# Patient Record
Sex: Male | Born: 1950
Health system: Southern US, Community
[De-identification: ages and names within clinical notes are randomized; demographics above are authoritative.]

## PROBLEM LIST (undated history)

## (undated) DIAGNOSIS — H3530 Unspecified macular degeneration: Secondary | ICD-10-CM

## (undated) DIAGNOSIS — H3320 Serous retinal detachment, unspecified eye: Secondary | ICD-10-CM

## (undated) DIAGNOSIS — I422 Other hypertrophic cardiomyopathy: Secondary | ICD-10-CM

## (undated) DIAGNOSIS — M1990 Unspecified osteoarthritis, unspecified site: Secondary | ICD-10-CM

## (undated) DIAGNOSIS — N200 Calculus of kidney: Secondary | ICD-10-CM

## (undated) HISTORY — DX: Calculus of kidney: N20.0

## (undated) HISTORY — DX: Unspecified macular degeneration: H35.30

## (undated) HISTORY — DX: Serous retinal detachment, unspecified eye: H33.20

## (undated) HISTORY — DX: Unspecified osteoarthritis, unspecified site: M19.90

## (undated) HISTORY — DX: Other hypertrophic cardiomyopathy: I42.2

## (undated) HISTORY — PX: HIP SURGERY: SHX245

---
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
## 1986-06-07 HISTORY — PX: BACK SURGERY: SHX140

## 1996-06-07 HISTORY — PX: KNEE SURGERY: SHX244

## 1999-09-30 ENCOUNTER — Encounter: Admission: RE | Admit: 1999-09-30 | Discharge: 1999-09-30 | Payer: Self-pay | Admitting: Internal Medicine

## 1999-09-30 ENCOUNTER — Encounter: Payer: Self-pay | Admitting: Internal Medicine

## 2001-02-07 ENCOUNTER — Encounter: Admission: RE | Admit: 2001-02-07 | Discharge: 2001-02-07 | Payer: Self-pay | Admitting: Internal Medicine

## 2001-02-07 ENCOUNTER — Encounter: Payer: Self-pay | Admitting: Internal Medicine

## 2002-02-22 ENCOUNTER — Ambulatory Visit (HOSPITAL_COMMUNITY): Admission: RE | Admit: 2002-02-22 | Discharge: 2002-02-22 | Payer: Self-pay | Admitting: Gastroenterology

## 2009-01-06 ENCOUNTER — Encounter: Payer: Self-pay | Admitting: Cardiology

## 2009-01-07 ENCOUNTER — Ambulatory Visit (HOSPITAL_COMMUNITY): Admission: RE | Admit: 2009-01-07 | Discharge: 2009-01-07 | Payer: Self-pay | Admitting: Cardiology

## 2010-05-21 ENCOUNTER — Ambulatory Visit: Payer: Self-pay | Admitting: Cardiology

## 2010-06-11 ENCOUNTER — Telehealth (INDEPENDENT_AMBULATORY_CARE_PROVIDER_SITE_OTHER): Payer: Self-pay | Admitting: Radiology

## 2010-06-15 ENCOUNTER — Encounter: Payer: Self-pay | Admitting: Cardiology

## 2010-06-15 ENCOUNTER — Encounter (HOSPITAL_COMMUNITY)
Admission: RE | Admit: 2010-06-15 | Discharge: 2010-07-07 | Payer: Self-pay | Source: Home / Self Care | Attending: Cardiology | Admitting: Cardiology

## 2010-06-15 ENCOUNTER — Ambulatory Visit: Admission: RE | Admit: 2010-06-15 | Discharge: 2010-06-15 | Payer: Self-pay | Source: Home / Self Care

## 2010-06-25 ENCOUNTER — Encounter
Admission: RE | Admit: 2010-06-25 | Discharge: 2010-06-25 | Payer: Self-pay | Source: Home / Self Care | Attending: Cardiology | Admitting: Cardiology

## 2010-06-29 ENCOUNTER — Ambulatory Visit: Payer: Self-pay | Admitting: Cardiology

## 2010-06-30 ENCOUNTER — Ambulatory Visit
Admission: RE | Admit: 2010-06-30 | Payer: Self-pay | Source: Home / Self Care | Attending: Cardiology | Admitting: Cardiology

## 2010-07-02 NOTE — Procedures (Addendum)
  NAMEBAYLEY, HURN NO.:  [id number]  MEDICAL RECORD NO.:  [id number]          PATIENT TYPE:  OIB  LOCATION:  1965                         FACILITY:  [facility]  PHYSICIAN:  Colleen Can. Deborah Chalk, M.D.DATE OF BIRTH:  Dec 03, 1950  DATE OF PROCEDURE:  06/30/2010 DATE OF DISCHARGE:  06/30/2010                           CARDIAC CATHETERIZATION   PROCEDURE:  Left heart catheterization with selective coronary angiography, left ventricular angiography.  TYPE AND SITE OF ENTRY:  Percutaneous right femoral artery.  CATHETERS:  4-French 4 curved Judkins right-left coronary catheter, 4- French pigtail ventriculographic catheter.  CONTRAST:  Omnipaque.  MEDICATIONS GIVEN PRIOR TO PROCEDURE:  Valium 2 mg.  MEDICATIONS GIVEN DURING PROCEDURE:  Versed 2 mg IV.  COMMENTS:  The patient tolerated the procedure well.  HEMODYNAMIC DATA:  The aortic pressure was 94/70, LV was 94/16.  There was no aortic valve gradient noted on pullback.  ANGIOGRAPHIC DATA:  Left ventricular angiogram was performed in the RAO position.  Overall, cardiac size and silhouette were normal.  The global ejection fraction was estimated to be at 60%.  There was no mitral regurgitation, intracardiac calcification, or intracavitary filling defect.  Regional wall motion was normal.  CORONARY ARTERIES:  Coronary arteries arise and distribute normally.  It is a large dominant right coronary artery system.  FINDINGS: 1. Left main coronary artery is normal. 2. Left anterior descending had minor irregularities, but otherwise     was essentially normal. 3. Left circumflex was a moderate-sized vessel.  It is normal. 4. The right coronary artery is a large dominant vessel.  It is     normal.  OVERALL IMPRESSION: 1. Normal left ventricular function. 2. Normal coronary arteries.  DISCUSSION:  In light of these findings, it is felt that Mr. Surrette has an excellent prognosis.  The equivocal findings on the  inferior septum of the radionuclide study is felt to be an error, and it is felt that he does not have any underlying inducible ischemia.  Overall, it is felt his prognosis is excellent.  He does have known proximal septal hypertrophy diagnosed by echocardiogram.  He did not have any outflow gradient detected by pullback of the catheters.     Colleen Can. Deborah Chalk, M.D.     SNT/MEDQ  D:  06/30/2010  T:  07/01/2010  Job:  [id number]  Electronically Signed by Roger Shelter M.D. on 07/02/2010 03:38:50 PM

## 2010-07-08 ENCOUNTER — Ambulatory Visit (INDEPENDENT_AMBULATORY_CARE_PROVIDER_SITE_OTHER): Payer: Self-pay | Admitting: *Deleted

## 2010-07-08 DIAGNOSIS — I2510 Atherosclerotic heart disease of native coronary artery without angina pectoris: Secondary | ICD-10-CM

## 2010-07-09 NOTE — Progress Notes (Signed)
Summary: nuc pre-procedure  Phone Note Outgoing Call   Call placed by: Domenic Polite, CNMT,  June 11, 2010 12:24 PM Call placed to: Patient Reason for Call: Confirm/change Appt Summary of Call: Left message with information on Myoview Information Sheet (see scanned document for details).      Nuclear Med Background Indications for Stress Test: Evaluation for Ischemia   History: Echo  History Comments: Echo- prox. septal hypertrophy  Symptoms: DOE, SOB    Nuclear Pre-Procedure Cardiac Risk Factors: Family History - CAD, Lipids

## 2010-07-09 NOTE — Assessment & Plan Note (Signed)
Summary: Cardiology Nuclear Testing  Nuclear Med Background Indications for Stress Test: Evaluation for Ischemia   History: Echo  History Comments: Echo- prox. septal hypertrophy  Symptoms: DOE, Palpitations, SOB    Nuclear Pre-Procedure Cardiac Risk Factors: Family History - CAD, Lipids Caffeine/Decaff Intake: none NPO After: 8:00 PM Lungs: clear IV 0.9% NS with Angio Cath: 20g     IV Site: R Hand IV Started by: Cathlyn Parsons, RN Chest Size (in) 42     Height (in): 71 Weight (lb): 198 BMI: 27.72 Tech Comments: Atenolol taken this am at 0645 per Dr. Deborah Chalk instructions.  Nuclear Med Study 1 or 2 day study:  1 day     Stress Test Type:  Stress Reading MD:  Olga Millers, MD     Referring MD:  S.Tennant Resting Radionuclide:  Technetium 82m Tetrofosmin     Resting Radionuclide Dose:  11 mCi  Stress Radionuclide:  Technetium 37m Tetrofosmin     Stress Radionuclide Dose:  33 mCi   Stress Protocol Exercise Time (min):  13:00 min     Max HR:  144 bpm     Predicted Max HR:  160 bpm  Max Systolic BP: 150 mm Hg     Percent Max HR:  90 %     METS: 15.3 Rate Pressure Product:  16109    Stress Test Technologist:  Milana Na, EMT-P     Nuclear Technologist:  Doyne Keel, CNMT  Rest Procedure  Myocardial perfusion imaging was performed at rest 45 minutes following the intravenous administration of Technetium 70m Tetrofosmin.  Stress Procedure  The patient exercised for 13:00. The patient stopped due to fatigue and denied any chest pain.  There were no significant ST-T wave changes and rare pacs/pvcs.  Technetium 51m Tetrofosmin was injected at peak exercise and myocardial perfusion imaging was performed after a brief delay.  QPS Raw Data Images:  Acquisition technically good; normal left ventricular size. Stress Images:  There is decreased uptake in the inferior wall Rest Images:  There is decreased uptake in the inferior wall, less prominent compared to the stress  images. Subtraction (SDS):  These findings are consistent with inferior thinning and mild ischemia in the inferobasal wall. Transient Ischemic Dilatation:  .98  (Normal <1.22)  Lung/Heart Ratio:  .29  (Normal <0.45)  Quantitative Gated Spect Images QGS EDV:  119 ml QGS ESV:  46 ml QGS EF:  61 % QGS cine images:  Normal wall motion.   Overall Impression  Exercise Capacity: Excellent exercise capacity. BP Response: Normal blood pressure response. Clinical Symptoms: No chest pain ECG Impression: No significant ST segment change suggestive of ischemia. Overall Impression: Abnormal stress nuclear study with inferior thinning and mild ischemia in the inferobasal wall.  Appended Document: Cardiology Nuclear Testing COPY SENT TO DR.TENNANT

## 2010-07-14 ENCOUNTER — Ambulatory Visit: Payer: Self-pay | Admitting: *Deleted

## 2010-07-28 NOTE — H&P (Signed)
  NAMEBRAELYNN, LUPTON NO.:  [id number]  MEDICAL RECORD NO.:  [id number]           PATIENT TYPE:  LOCATION:                                 FACILITY:  PHYSICIAN:  Colleen Can. Deborah Chalk, M.D.DATE OF BIRTH:  July 18, 1950  DATE OF ADMISSION: DATE OF DISCHARGE:                             HISTORY & PHYSICAL   REASON FOR ADMISSION:  Cardiac catheterization.  HISTORY:  Mr. Gang has a history of abnormal proximal septal hypertrophy and a history of syncope with exertional dizziness.  He was seen in December and noted that he had increasing shortness of breath with walking up a hill.  He had a stress Cardiolite study which showed abnormalities with inferior basilar ischemia.  It was somewhat equivocal finding, but in light of the abnormality, he is referred for cardiac catheterization.  He had excellent exercise tolerance and was able to exercise for 13 minutes and had no ST or T wave changes; however, because of this abnormality, as well as symptoms of exertional shortness of breath, he is referred for catheterization.  REVIEW OF SYSTEMS:  Positive for macular degeneration and history of detached retina as well as low back discomfort and degenerative spine disease.  MEDICINES:  Naprosyn daily, multivitamin, vitamin D, atenolol 25 mg a day.  SOCIAL HISTORY:  He is married with children.  He is nonsmoker.  He exercises regularly.  He is an Airline pilot.  FAMILY HISTORY:  Father died at age 20 of arrhythmia and brain cancer. Mother is still living at age 91.  One maternal grandmother died in her 30s of heart attack, one great-aunt died in her earlier 55s.  ALLERGIES:  CODEINE.  OTHER PAST MEDICAL PROBLEMS:  History of kidney stones as well as right knee arthroscopy.  PHYSICAL EXAMINATION:  GENERAL:  He is a well-developed white male, in no acute distress. VITAL SIGNS:  His weight is 193.  Blood pressure is 118/78. HEENT:  He is normocephalic and atraumatic.   Pupils equally round and reactive to light. NECK:  Supple without bruits.  No masses.  No thyromegaly. CARDIAC:  S1 and S2 to be normal.  No murmur could be appreciated. ABDOMEN:  Soft, nontender. EXTREMITIES:  Without edema.  OVERALL ASSESSMENT: 1. Exertional shortness of breath with questionable abnormality of the     inferior basilar portion of a stress Cardiolite study with an     excellent exercise tolerance. 2. Proximal septal hypertrophy with history of syncope. 3. Macular degeneration with history of detached retina. 4. Degenerative disease of the spine.  PLAN:  We will proceed on with cardiac catheterization and coronary arteriograms.  Procedure risks and benefits have been explained, risks including heart attack, stroke, heart stoppage, death, allergy, emboli, and bleeding have all been explained.  The patient is willing to proceed.     Colleen Can. Deborah Chalk, M.D.     SNT/MEDQ  D:  06/30/2010  T:  06/30/2010  Job:  [id number]  Electronically Signed by Roger Shelter M.D. on 07/28/2010 09:12:23 AM

## 2010-10-23 NOTE — Op Note (Signed)
   NAME:  Albert Alvarez, Albert Alvarez                        ACCOUNT NO.:  [id number]   MEDICAL RECORD NO.:  [id number]                   PATIENT TYPE:  AMB   LOCATION:  ENDO                                 FACILITY:  [facility]   PHYSICIAN:  Charna Elizabeth, M.D.                   DATE OF BIRTH:  1950-06-28   DATE OF PROCEDURE:  DATE OF DISCHARGE:                                 OPERATIVE REPORT   PROCEDURE PERFORMED:  Screening colonoscopy.   ENDOSCOPIST:  Charna Elizabeth, M.D.   INSTRUMENT USED:  Olympus video colonoscope.   INDICATIONS FOR PROCEDURE:  The patient is a 60 year old white male with a  history of rare bright red blood per rectum (with hard stool) undergoing  screening colonoscopy to rule out colonic polyps, masses, hemorrhoids, etc.   PREPROCEDURE PREPARATION:  Informed consent was procured from the patient.  The patient was fasted for eight hours prior to the procedure and prepped  with a bottle of magnesium citrate and a gallon of NuLytely the night prior  to the procedure.   PREPROCEDURE PHYSICAL:  The patient had stable vital signs. Neck supple.  Chest clear to auscultation.  S1 and S2 regular.  Abdomen soft with normal  bowel sounds.   DESCRIPTION OF PROCEDURE:  The patient was placed in left lateral decubitus  position and sedated with 60 mg of Demerol and 6 mg of Versed intravenously.  Once the patient was adequately sedated and maintained on low flow oxygen  and continuous cardiac monitoring, the Olympus video colonoscope was  advanced from the rectum to the cecum without difficulty.  The appendicular  orifice and the ileocecal valve were clearly visualized and photographed.  The entire colonic mucosa appeared healthy with no evidence of erosions,  ulcerations, masses polyps or diverticulosis.  No hemorrhoids were seen.   IMPRESSION:  Normal colonoscopy.    RECOMMENDATIONS:  1. Repeat colorectal cancer screening is recommended in the next 10 years     unless the patient  develops any abnormal symptoms in the interim.  2. A high fiber diet has been advised with liberal fluids.                                                  Charna Elizabeth, M.D.    JM/MEDQ  D:  02/22/2002  T:  02/23/2002  Job:  [id number]   cc:   Theressa Millard, MD  [address] [place]  [place] [address]  Fax: [phone]

## 2011-03-15 ENCOUNTER — Other Ambulatory Visit: Payer: Self-pay | Admitting: Cardiology

## 2011-07-14 ENCOUNTER — Ambulatory Visit (INDEPENDENT_AMBULATORY_CARE_PROVIDER_SITE_OTHER): Payer: BC Managed Care – PPO | Admitting: Cardiology

## 2011-07-14 ENCOUNTER — Encounter: Payer: Self-pay | Admitting: Cardiology

## 2011-07-14 VITALS — BP 138/86 | HR 61 | Wt 195.6 lb

## 2011-07-14 DIAGNOSIS — I422 Other hypertrophic cardiomyopathy: Secondary | ICD-10-CM

## 2011-07-14 NOTE — Progress Notes (Signed)
Albert Alvarez Date of Birth: Dec 31, 1950 Medical Record #[id number]  History of Present Illness: Albert Alvarez is seen today to establish cardiac followup. He is a former patient of Dr. Deborah Chalk. He has a variant of hypertrophic cardiomyopathy. He initially presented with symptoms of near syncope on exertion. Echocardiogram in August of 2010 showed mild to moderate LVH with prominent upper septal hypertrophy. He also had systolic anterior motion of the mitral valve with mild insufficiency. He was placed on atenolol. His symptoms resolved. He still has mild symptoms of dyspnea on exertion that are unpredictable. It typically occurs when he is walking uphill or up an incline but it doesn't always occur. He does exercise regularly. His family has been screened for hypertrophic cardiomyopathy. He underwent evaluation with a nuclear stress test in January 2012. This showed possible inferior thinning with mild ischemia. He then underwent cardiac catheterization which demonstrated normal coronary anatomy and normal left ventricular function. He did not have an aortic outflow gradient by cath.  Current Outpatient Prescriptions on File Prior to Visit  Medication Sig Dispense Refill  . atenolol (TENORMIN) 25 MG tablet TAKE 1 TABLET BY MOUTH EVERY DAY  30 tablet  4  . Cholecalciferol (VITAMIN D PO) Take by mouth daily.        . Multiple Vitamin (MULTIVITAMIN) tablet Take 1 tablet by mouth daily.        . naproxen (NAPROSYN) 500 MG tablet Take 500 mg by mouth daily.          Allergies  Allergen Reactions  . Codeine     Past Medical History  Diagnosis Date  . Hypertrophic cardiomyopathy   . Macular degeneration   . DJD (degenerative joint disease)     spine  . Detached retina     Past Surgical History  Procedure Date  . Knee surgery   . Back surgery   . US echocardiography 09-16-49    EF 61%    History  Smoking status  . Never Smoker   Smokeless tobacco  . Not on file    History  Alcohol  Use No    Family History  Problem Relation Age of Onset  . Arrhythmia Father     Review of Systems: The review of systems is positive for macular degeneration with near blindness in his left eye.  All other systems were reviewed and are negative.  Physical Exam: BP 138/86  Pulse 61  Wt 195 lb 9.6 oz (88.724 kg)  SpO2 95% He is a pleasant white male in no acute distress.The patient is alert and oriented x 3.  The mood and affect are normal.  The skin is warm and dry.  Color is normal.  The HEENT exam reveals that the sclera are nonicteric.  The mucous membranes are moist.  The carotids are 2+ without bruits.  There is no thyromegaly.  There is no JVD.  The lungs are clear.  The chest wall is non tender.  The heart exam reveals a regular rate with a normal S1 and S2.  There are no murmurs, gallops, or rubs.  The PMI is not displaced.   Abdominal exam reveals good bowel sounds.  There is no guarding or rebound.  There is no hepatosplenomegaly or tenderness.  There are no masses.  Exam of the legs reveal no clubbing, cyanosis, or edema.  The legs are without rashes.  The distal pulses are intact.  Cranial nerves II - XII are intact.  Motor and sensory functions are intact.  The gait is normal.   LABORATORY DATA:   Assessment / Plan:

## 2011-07-14 NOTE — Assessment & Plan Note (Signed)
We will update his echocardiogram to see if there's been any change since August of 2010. We will continue with his atenolol therapy. Continue with his exercise program. I will followup again in one year.

## 2011-07-14 NOTE — Patient Instructions (Signed)
Continue your current therapy  We will schedule you for an echocardiogram.  I will see you in 1 year.

## 2011-07-15 ENCOUNTER — Ambulatory Visit (HOSPITAL_COMMUNITY): Payer: BC Managed Care – PPO | Attending: Cardiology

## 2011-07-15 DIAGNOSIS — I422 Other hypertrophic cardiomyopathy: Secondary | ICD-10-CM

## 2011-07-15 DIAGNOSIS — R0989 Other specified symptoms and signs involving the circulatory and respiratory systems: Secondary | ICD-10-CM | POA: Insufficient documentation

## 2011-07-15 DIAGNOSIS — R0609 Other forms of dyspnea: Secondary | ICD-10-CM | POA: Insufficient documentation

## 2011-08-20 ENCOUNTER — Other Ambulatory Visit: Payer: Self-pay

## 2011-08-20 ENCOUNTER — Other Ambulatory Visit: Payer: Self-pay | Admitting: Cardiology

## 2011-08-20 ENCOUNTER — Other Ambulatory Visit: Payer: Self-pay | Admitting: Cardiothoracic Surgery

## 2011-08-20 MED ORDER — ATENOLOL 25 MG PO TABS: 25.0000 mg | ORAL_TABLET | Freq: Every day | ORAL | Status: DC

## 2012-08-16 ENCOUNTER — Other Ambulatory Visit: Payer: Self-pay | Admitting: Cardiology

## 2012-08-16 NOTE — Telephone Encounter (Signed)
Called and spoke with Albert Alvarez and explained he needed to schedule a follow up office visit to receive further refills. 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 Albert Alvarez verbalized his understanding that without him scheduling a follow up office visit he will not receive further refills.

## 2012-10-14 ENCOUNTER — Other Ambulatory Visit: Payer: Self-pay | Admitting: Cardiology

## 2012-10-18 ENCOUNTER — Ambulatory Visit (INDEPENDENT_AMBULATORY_CARE_PROVIDER_SITE_OTHER): Payer: BC Managed Care – PPO | Admitting: Cardiovascular Disease

## 2012-10-18 ENCOUNTER — Encounter: Payer: Self-pay | Admitting: Cardiovascular Disease

## 2012-10-18 VITALS — BP 118/76 | HR 61 | Ht 70.0 in | Wt 201.0 lb

## 2012-10-18 DIAGNOSIS — I517 Cardiomegaly: Secondary | ICD-10-CM | POA: Insufficient documentation

## 2012-10-18 MED ORDER — ATENOLOL 25 MG PO TABS: ORAL_TABLET | ORAL | Status: DC

## 2012-10-18 NOTE — Progress Notes (Signed)
10/18/2012 Albert Alvarez   12/06/1950  [id number]  Primary Physician Darnelle Bos, MD Primary Cardiologist: Dr. Runell Alvarez M.D. FACC  HPI:  Mr. Albert Alvarez is a very pleasant 62 year old appearing married Caucasian male father of 3, grandfather to 6 grandchildren was formally a patient of Dr. Roger Alvarez, Dr. Peter Alvarez and now is transferring his care to myself. He currently works as an Airline pilot. His Percocet profile is entirely benign. He's never had a heart attack or stroke and denies chest pain. Does get some mild dyspnea on exertion. He had a 2-D echo performed several years ago that apparently showed asymmetrical septal hypertrophy with preserved ejection fraction. 2 Myoview stress test which was normal and at that time an event monitor which did not show any arrhythmias.   Current Outpatient Prescriptions  Medication Sig Dispense Refill  . atenolol (TENORMIN) 25 MG tablet TAKE 1 TABLET (25 MG TOTAL) BY MOUTH DAILY.  30 tablet  0  . BIOFLAVONOID PRODUCTS PO Take 1,000 mg by mouth daily.      . Cholecalciferol (VITAMIN D PO) Take by mouth daily.        . Multiple Vitamin (MULTIVITAMIN) tablet Take 1 tablet by mouth daily.        . naproxen (NAPROSYN) 500 MG tablet Take 500 mg by mouth daily.         No current facility-administered medications for this visit.    Allergies  Allergen Reactions  . Codeine     History   Social History  . Marital Status: Married    Spouse Name: N/A    Number of Children: 3  . Years of Education: N/A   Occupational History  . [employer]   . accountant     [employer]   Social History Main Topics  . Smoking status: Never Smoker   . Smokeless tobacco: Never Used  . Alcohol Use: No  . Drug Use: Not on file  . Sexually Active: Not on file   Other Topics Concern  . Not on file   Social History Narrative  . No narrative on file     Review of Systems: General: negative for chills, fever, night sweats or weight changes.   Cardiovascular: negative for chest pain, dyspnea on exertion, edema, orthopnea, palpitations, paroxysmal nocturnal dyspnea or shortness of breath Dermatological: negative for rash Respiratory: negative for cough or wheezing Urologic: negative for hematuria Abdominal: negative for nausea, vomiting, diarrhea, bright red blood per rectum, melena, or hematemesis Neurologic: negative for visual changes, syncope, or dizziness All other systems reviewed and are otherwise negative except as noted above.    Blood pressure 118/76, pulse 61, height 5\' 10"  (1.778 m), weight 201 lb (91.173 kg).  General appearance: alert and no distress Neck: no adenopathy, no carotid bruit, no JVD, supple, symmetrical, trachea midline and thyroid not enlarged, symmetric, no tenderness/mass/nodules Lungs: clear to auscultation bilaterally Heart: regular rate and rhythm, S1, S2 normal, no murmur, click, rub or gallop Extremities: extremities normal, atraumatic, no cyanosis or edema Pulses: 2+ and symmetric  EKG normal sinus rhythm at 61 without ST or T wave changes  ASSESSMENT AND PLAN:   LVH (left ventricular hypertrophy) The patient has asymmetric septal hypertrophy on 2-D echo several years ago. He has normal left blood function. We will continue to follow him clinically.       [address] JMD, Nicholes Calamity, [place] 10/18/2012 4:43 PM

## 2012-10-18 NOTE — Assessment & Plan Note (Signed)
The patient has asymmetric septal hypertrophy on 2-D echo several years ago. He has normal left blood function. We will continue to follow him clinically.

## 2012-10-18 NOTE — Patient Instructions (Signed)
Your physician wants you to follow-up in: 1 YEAR You will receive a reminder letter in the mail two months in advance. If you don't receive a letter, please call our office to schedule the follow-up appointment.  

## 2012-11-25 ENCOUNTER — Ambulatory Visit: Payer: BC Managed Care – PPO

## 2012-11-25 ENCOUNTER — Encounter: Payer: Self-pay | Admitting: Family Medicine

## 2012-11-25 ENCOUNTER — Ambulatory Visit (INDEPENDENT_AMBULATORY_CARE_PROVIDER_SITE_OTHER): Payer: BC Managed Care – PPO | Admitting: Family Medicine

## 2012-11-25 VITALS — BP 110/82 | HR 72 | Temp 97.9°F | Resp 16 | Ht 71.75 in | Wt 199.2 lb

## 2012-11-25 DIAGNOSIS — R05 Cough: Secondary | ICD-10-CM

## 2012-11-25 DIAGNOSIS — R059 Cough, unspecified: Secondary | ICD-10-CM

## 2012-11-25 DIAGNOSIS — R0789 Other chest pain: Secondary | ICD-10-CM

## 2012-11-25 DIAGNOSIS — J209 Acute bronchitis, unspecified: Secondary | ICD-10-CM

## 2012-11-25 LAB — POCT CBC
HCT, POC: 44.7 % (ref 43.5–53.7)
Hemoglobin: 14.5 g/dL (ref 14.1–18.1)
MCH, POC: 33.2 pg — AB (ref 27–31.2)
MPV: 9.1 fL (ref 0–99.8)
POC MID %: 7 %M (ref 0–12)
RBC: 4.37 M/uL — AB (ref 4.69–6.13)
WBC: 10.7 10*3/uL — AB (ref 4.6–10.2)

## 2012-11-25 MED ORDER — HYDROCODONE-HOMATROPINE 5-1.5 MG/5ML PO SYRP: 5.0000 mL | ORAL_SOLUTION | ORAL | Status: DC | PRN

## 2012-11-25 MED ORDER — AZITHROMYCIN 250 MG PO TABS: ORAL_TABLET | ORAL | Status: DC

## 2012-11-25 MED ORDER — BENZONATATE 100 MG PO CAPS: ORAL_CAPSULE | ORAL | Status: DC

## 2012-11-25 NOTE — Patient Instructions (Signed)
Take azithromycin 2 tablets initially, then one daily for infection  Take Tessalon one or 2 tablets 3 times daily as needed for cough.  Take Hycodan ( hydrocodone syrup) every 4-6 hours as needed for cough. It can overlap with the Tessalon. It will make you sedated, so don't use it when you're trying to work.  Drink plenty of fluids  Consider taking over-the-counter Mucinex also to help thin the secretions  In the event of changing chest pains get attention immediately

## 2012-11-25 NOTE — Progress Notes (Signed)
Subjective: 62 year old man with history of being ill for the last 8 days. He has had a cough and congestion. He is going through a phase of coughing up green phlegm, and it is become wider. He felt a little better yesterday and went to work for half a day. Today he comes in here because during the night he had lots of severe coughing and chest tightness across the whole chest wallwhen he is coughing. He is a nonsmoker. His wife is not ill. He is not having much in the way of headache congestion. Only mild sore throat from coughing. No ear problems. No definite fevers though he feels a little clammy.hoarseness   He is a IT trainer. His main medical problem has been a hypertrophic cardiomyopathy for which she is now on a beta blocker. Dr. Gery Pray is cardiologist.  Objective: Alert oriented man in no major distress when he is sitting still. Skin is slightly clammy. His TMs are normal. Throat clear, nonerythematous. Neck supple without significant nodes. Chest is clear to percussion. On auscultation he has a minimal wheeze heard in the right upper anterior chest. Peak flow was good at 550 without dictated 520 or 30.  Assessment: Prolonged cough with minimal wheeze Laryngitis   Plan: Chest x-ray, CBC  Results for orders placed in visit on 11/25/12  POCT CBC      Result Value Range   WBC 10.7 (*) 4.6 - 10.2 K/uL   Lymph, poc 1.5  0.6 - 3.4   POC LYMPH PERCENT 14.2  10 - 50 %L   MID (cbc) 0.7  0 - 0.9   POC MID % 7.0  0 - 12 %M   POC Granulocyte 8.4 (*) 2 - 6.9   Granulocyte percent 78.8  37 - 80 %G   RBC 4.37 (*) 4.69 - 6.13 M/uL   Hemoglobin 14.5  14.1 - 18.1 g/dL   HCT, POC 40.9  81.1 - 53.7 %   MCV 102.2 (*) 80 - 97 fL   MCH, POC 33.2 (*) 27 - 31.2 pg   MCHC 32.4  31.8 - 35.4 g/dL   RDW, POC 91.4     Platelet Count, POC 319  142 - 424 K/uL   MPV 9.1  0 - 99.8 fL   [facility] reading (PRIMARY) by  Dr. Alwyn Ren Normal cxr  Assessment: Bronchitis with secondary chest tightness.  Treat with  zithromax and symptomatic rx.

## 2013-08-30 ENCOUNTER — Encounter: Payer: Self-pay | Admitting: Cardiology

## 2013-10-30 ENCOUNTER — Ambulatory Visit (INDEPENDENT_AMBULATORY_CARE_PROVIDER_SITE_OTHER): Payer: 59 | Admitting: Cardiovascular Disease

## 2013-10-30 ENCOUNTER — Encounter: Payer: Self-pay | Admitting: Cardiovascular Disease

## 2013-10-30 VITALS — BP 118/70 | HR 58 | Ht 70.0 in | Wt 203.0 lb

## 2013-10-30 DIAGNOSIS — I422 Other hypertrophic cardiomyopathy: Secondary | ICD-10-CM

## 2013-10-30 DIAGNOSIS — I517 Cardiomegaly: Secondary | ICD-10-CM

## 2013-10-30 NOTE — Assessment & Plan Note (Signed)
Patient has a history of hypertrophic cardiomyopathy. It is unclear to me whether it's asymmetric septal or concentric. He's not had an 2-D echo in several years. He does complain of progressive noticeable dyspnea on exertion. He is fairly active and walks frequently as well as plays golf. He denies chest pain. I'm going to repeat a 2-D echocardiogram. I cannot hear a murmur on exam.

## 2013-10-30 NOTE — Patient Instructions (Signed)
  We will see you back in follow up in 1 year with Dr Gwenlyn Found  Dr Gwenlyn Found has ordered an  Echocardiogram. Echocardiography is a painless test that uses sound waves to create images of your heart. It provides your doctor with information about the size and shape of your heart and how well your heart's chambers and valves are working. This procedure takes approximately one hour. There are no restrictions for this procedure.

## 2013-10-30 NOTE — Progress Notes (Signed)
10/30/2013 Albert Alvarez   25-Jun-1950  [id number]  Primary Physician Albert Finer, MD Primary Cardiologist: Albert Harp MD Albert Alvarez   HPI:  Albert Alvarez is a very pleasant 63 year old appearing married Caucasian male father of 22, grandfather to 6 grandchildren was formally a patient of Albert Alvarez, Albert Alvarez and now is transferring his care to myself. He currently works as an Optometrist. His Percocet profile is entirely benign. He's never had a heart attack or stroke and denies chest pain. Does get some mild dyspnea on exertion. He had a 2-D echo performed several years ago that apparently showed asymmetrical septal hypertrophy with preserved ejection fraction. 2 Myoview stress test which was normal and at that time an event monitor which did not show any arrhythmias. Since I saw him in the office one year ago he has remained remarkably stable. He is fairly active and walks frequently complaining of some mild dyspnea on exertion during the first toe does walk. He denies chest pain. 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    Current Outpatient Prescriptions  Medication Sig Dispense Refill  . atenolol (TENORMIN) 25 MG tablet TAKE 1 TABLET (25 MG TOTAL) BY MOUTH DAILY.  90 tablet  3  . BIOFLAVONOID PRODUCTS PO Take 1,000 mg by mouth daily.      . Cholecalciferol (VITAMIN D PO) Take by mouth daily.        . Multiple Vitamin (MULTIVITAMIN) tablet Take 1 tablet by mouth daily.        . naproxen (NAPROSYN) 500 MG tablet Take 500 mg by mouth daily.         No current facility-administered medications for this visit.    Allergies  Allergen Reactions  . Codeine     History   Social History  . Marital Status: Married    Spouse Name: N/A    Number of Children: 3  . Years of Education: N/A   Occupational History  . [employer]   . accountant     [employer]   Social History Main Topics  . Smoking  status: Never Smoker   . Smokeless tobacco: Never Used  . Alcohol Use: No  . Drug Use: Not on file  . Sexual Activity: Yes   Other Topics Concern  . Not on file   Social History Narrative  . No narrative on file     Review of Systems: General: negative for chills, fever, night sweats or weight changes.  Cardiovascular: negative for chest pain, dyspnea on exertion, edema, orthopnea, palpitations, paroxysmal nocturnal dyspnea or shortness of breath Dermatological: negative for rash Respiratory: negative for cough or wheezing Urologic: negative for hematuria Abdominal: negative for nausea, vomiting, diarrhea, bright red blood per rectum, melena, or hematemesis Neurologic: negative for visual changes, syncope, or dizziness All other systems reviewed and are otherwise negative except as noted above.    Blood pressure 118/70, pulse 58, height 5\' 10"  (1.778 m), weight 203 lb (92.08 kg).  General appearance: alert and no distress Neck: no adenopathy, no carotid bruit, no JVD, supple, symmetrical, trachea midline and thyroid not enlarged, symmetric, no tenderness/mass/nodules Lungs: clear to auscultation bilaterally Heart: regular rate and rhythm, S1, S2 normal, no murmur, click, rub or gallop Extremities: extremities normal, atraumatic, no cyanosis or edema  EKG sinus bradycardia at 58 without ST or T wave changes  ASSESSMENT AND PLAN:   Hypertrophic cardiomyopathy Patient has a  history of hypertrophic cardiomyopathy. It is unclear to me whether it's asymmetric septal or concentric. He's not had an 2-D echo in several years. He does complain of progressive noticeable dyspnea on exertion. He is fairly active and walks frequently as well as plays golf. He denies chest pain. I'm going to repeat a 2-D echocardiogram. I cannot hear a murmur on exam.      Albert Harp MD [facility], [facility] 10/30/2013 9:20 AM

## 2013-11-15 ENCOUNTER — Other Ambulatory Visit: Payer: Self-pay | Admitting: *Deleted

## 2013-11-15 MED ORDER — ATENOLOL 25 MG PO TABS: ORAL_TABLET | ORAL | Status: DC

## 2013-11-15 NOTE — Telephone Encounter (Signed)
Rx refill sent to patient pharmacy   

## 2013-11-26 ENCOUNTER — Ambulatory Visit (HOSPITAL_COMMUNITY): Payer: 59

## 2013-12-03 ENCOUNTER — Ambulatory Visit (HOSPITAL_COMMUNITY)
Admission: RE | Admit: 2013-12-03 | Discharge: 2013-12-03 | Disposition: A | Discharge status: Home / Self Care | Payer: 59 | Source: Ambulatory Visit | Attending: Cardiology | Admitting: Cardiology

## 2013-12-03 DIAGNOSIS — I422 Other hypertrophic cardiomyopathy: Secondary | ICD-10-CM

## 2013-12-03 DIAGNOSIS — I379 Nonrheumatic pulmonary valve disorder, unspecified: Secondary | ICD-10-CM

## 2013-12-03 DIAGNOSIS — I517 Cardiomegaly: Secondary | ICD-10-CM | POA: Insufficient documentation

## 2013-12-03 NOTE — Progress Notes (Signed)
2D Echo Performed 12/03/2013    Marygrace Drought, RCS

## 2014-02-05 ENCOUNTER — Other Ambulatory Visit: Payer: Self-pay | Admitting: Cardiovascular Disease

## 2014-02-06 NOTE — Telephone Encounter (Signed)
Rx was sent to pharmacy electronically. 

## 2014-03-21 ENCOUNTER — Ambulatory Visit
Admission: RE | Admit: 2014-03-21 | Discharge: 2014-03-21 | Disposition: A | Discharge status: Home / Self Care | Payer: 59 | Source: Ambulatory Visit | Attending: Nurse Practitioner | Admitting: Nurse Practitioner

## 2014-03-21 ENCOUNTER — Other Ambulatory Visit: Payer: Self-pay | Admitting: Nurse Practitioner

## 2014-03-21 DIAGNOSIS — J209 Acute bronchitis, unspecified: Secondary | ICD-10-CM

## 2014-04-02 ENCOUNTER — Other Ambulatory Visit: Payer: Self-pay | Admitting: Cardiovascular Disease

## 2014-04-02 NOTE — Telephone Encounter (Signed)
Refilled #90 tablets with 0 refills on 02/06/2014 (3 month supply)

## 2014-04-02 NOTE — Telephone Encounter (Signed)
Rx was sent to pharmacy electronically. 

## 2014-06-04 ENCOUNTER — Other Ambulatory Visit: Payer: Self-pay | Admitting: Cardiovascular Disease

## 2014-06-04 NOTE — Telephone Encounter (Signed)
Rx(s) sent to pharmacy electronically.  

## 2014-09-17 ENCOUNTER — Other Ambulatory Visit: Payer: Self-pay | Admitting: *Deleted

## 2014-09-17 MED ORDER — ATENOLOL 25 MG PO TABS: 25.0000 mg | ORAL_TABLET | Freq: Every day | ORAL | Status: DC

## 2014-09-20 ENCOUNTER — Other Ambulatory Visit: Payer: Self-pay

## 2014-09-20 MED ORDER — ATENOLOL 25 MG PO TABS: 25.0000 mg | ORAL_TABLET | Freq: Every day | ORAL | Status: DC

## 2014-09-20 NOTE — Telephone Encounter (Signed)
Rx(s) sent to pharmacy electronically.  

## 2014-11-15 ENCOUNTER — Other Ambulatory Visit: Payer: Self-pay

## 2014-11-15 MED ORDER — ATENOLOL 25 MG PO TABS: 25.0000 mg | ORAL_TABLET | Freq: Every day | ORAL | Status: DC

## 2014-11-15 NOTE — Telephone Encounter (Signed)
Rx(s) sent to pharmacy electronically.  

## 2014-11-26 ENCOUNTER — Ambulatory Visit: Payer: Self-pay | Admitting: Cardiovascular Disease

## 2014-12-24 ENCOUNTER — Ambulatory Visit (INDEPENDENT_AMBULATORY_CARE_PROVIDER_SITE_OTHER): Payer: 59 | Admitting: Cardiovascular Disease

## 2014-12-24 ENCOUNTER — Encounter: Payer: Self-pay | Admitting: Cardiovascular Disease

## 2014-12-24 VITALS — BP 116/77 | HR 65 | Ht 70.0 in | Wt 203.0 lb

## 2014-12-24 DIAGNOSIS — I422 Other hypertrophic cardiomyopathy: Secondary | ICD-10-CM

## 2014-12-24 DIAGNOSIS — I517 Cardiomegaly: Secondary | ICD-10-CM

## 2014-12-24 NOTE — Patient Instructions (Signed)
Dr Berry recommends that you schedule a follow-up appointment in 1 year. You will receive a reminder letter in the mail two months in advance. If you don't receive a letter, please call our office to schedule the follow-up appointment. 

## 2014-12-24 NOTE — Progress Notes (Signed)
12/24/2014 Jolyn Nap   01-25-1951  [id number]  Primary Physician No PCP Per Patient Primary Cardiologist: Lorretta Harp MD Renae Gloss   HPI:  Mr. Plant is a very pleasant 64 year old appearing married Caucasian male father of 44, grandfather to 6 grandchildren was formally a patient of Dr. Romeo Apple, Dr. Peter Martinique and transferred  his care to myself.  His Percocet profile is entirely benign. He's never had a heart attack or stroke and denies chest pain. Does get some mild dyspnea on exertion. He had a 2-D echo performed several years ago that apparently showed asymmetrical septal hypertrophy with preserved ejection fraction. 2 Myoview stress test which was normal and at that time an event monitor which did not show any arrhythmias. Since I saw him in the office one year ago he has remained remarkably stable. He is fairly active and walks frequently complaining of some mild dyspnea on exertion during the first toe does walk. He denies chest pain. 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  Current Outpatient Prescriptions  Medication Sig Dispense Refill  . atenolol (TENORMIN) 25 MG tablet Take 1 tablet (25 mg total) by mouth daily. MUST KEEP APPOINTMENT 12/24/2014 WITH DR BERRY FOR FUTURE REFILLS 30 tablet 1  . BIOFLAVONOID PRODUCTS PO Take 1,000 mg by mouth daily.    . Cholecalciferol (VITAMIN D PO) Take by mouth daily.      . Multiple Vitamin (MULTIVITAMIN) tablet Take 1 tablet by mouth daily.      . naproxen (NAPROSYN) 500 MG tablet Take 500 mg by mouth daily.       No current facility-administered medications for this visit.    Allergies  Allergen Reactions  . Codeine     History   Social History  . Marital Status: Married    Spouse Name: N/A  . Number of Children: 3  . Years of Education: N/A   Occupational History  . [employer]   . accountant     [employer]   Social History Main  Topics  . Smoking status: Never Smoker   . Smokeless tobacco: Never Used  . Alcohol Use: No  . Drug Use: Not on file  . Sexual Activity: Yes   Other Topics Concern  . Not on file   Social History Narrative     Review of Systems: General: negative for chills, fever, night sweats or weight changes.  Cardiovascular: negative for chest pain, dyspnea on exertion, edema, orthopnea, palpitations, paroxysmal nocturnal dyspnea or shortness of breath Dermatological: negative for rash Respiratory: negative for cough or wheezing Urologic: negative for hematuria Abdominal: negative for nausea, vomiting, diarrhea, bright red blood per rectum, melena, or hematemesis Neurologic: negative for visual changes, syncope, or dizziness All other systems reviewed and are otherwise negative except as noted above.    Blood pressure 116/77, pulse 65, height 5\' 10"  (1.778 m), weight 203 lb (92.08 kg).  General appearance: alert and no distress Neck: no adenopathy, no carotid bruit, no JVD, supple, symmetrical, trachea midline and thyroid not enlarged, symmetric, no tenderness/mass/nodules Lungs: clear to auscultation bilaterally Heart: regular rate and rhythm, S1, S2 normal, no murmur, click, rub or gallop Extremities: extremities normal, atraumatic, no cyanosis or edema  EKG normal sinus rhythm at 65 without ST or T-wave changes. I personally reviewed this EKG  ASSESSMENT AND PLAN:   LVH (left ventricular hypertrophy) History of left ventricular hypertrophy on 2-D echo  as recently as one year ago. He has normal LV function.  Hypertrophic cardiomyopathy Mr. Woody has a history of "hypertrophic cardio myopathy. His last echo performed a year ago showed normal LV systolic function, mild LVH with focal basal hypertrophy although there was no outflow tract gradient and he is relatively asymptomatic.      Lorretta Harp MD FACP,FACC,FAHA, [facility] 12/24/2014 8:14 AM

## 2014-12-24 NOTE — Assessment & Plan Note (Signed)
Albert Alvarez has a history of "hypertrophic cardio myopathy. His last echo performed a year ago showed normal LV systolic function, mild LVH with focal basal hypertrophy although there was no outflow tract gradient and he is relatively asymptomatic.

## 2014-12-24 NOTE — Assessment & Plan Note (Signed)
History of left ventricular hypertrophy on 2-D echo as recently as one year ago. He has normal LV function.

## 2015-01-22 ENCOUNTER — Other Ambulatory Visit: Payer: Self-pay | Admitting: *Deleted

## 2015-01-22 ENCOUNTER — Other Ambulatory Visit: Payer: Self-pay | Admitting: Cardiovascular Disease

## 2015-01-22 MED ORDER — ATENOLOL 25 MG PO TABS: 25.0000 mg | ORAL_TABLET | Freq: Every day | ORAL | Status: DC

## 2015-01-22 NOTE — Telephone Encounter (Signed)
Rx request sent to pharmacy.  

## 2015-01-23 ENCOUNTER — Other Ambulatory Visit: Payer: Self-pay | Admitting: Cardiovascular Disease

## 2015-01-23 NOTE — Telephone Encounter (Signed)
Rx(s) sent to pharmacy electronically.  

## 2015-12-24 ENCOUNTER — Encounter: Payer: Self-pay | Admitting: Cardiovascular Disease

## 2015-12-24 ENCOUNTER — Ambulatory Visit (INDEPENDENT_AMBULATORY_CARE_PROVIDER_SITE_OTHER): Payer: 59 | Admitting: Cardiovascular Disease

## 2015-12-24 VITALS — BP 122/74 | HR 54 | Ht 70.0 in | Wt 185.0 lb

## 2015-12-24 DIAGNOSIS — I422 Other hypertrophic cardiomyopathy: Secondary | ICD-10-CM | POA: Diagnosis not present

## 2015-12-24 NOTE — Patient Instructions (Signed)
Medication Instructions:  Your physician recommends that you continue on your current medications as directed. Please refer to the Current Medication list given to you today.   Labwork: Labwork will be requested from your primary care physician.  Follow-Up: Your physician wants you to follow-up in: 12 MONTHS WITH DR BERRY. You will receive a reminder letter in the mail two months in advance. If you don't receive a letter, please call our office to schedule the follow-up appointment.   If you need a refill on your cardiac medications before your next appointment, please call your pharmacy.   

## 2015-12-24 NOTE — Assessment & Plan Note (Signed)
History of hypertrophic myopathy with his last echo performed 12/03/13 revealing normal LV systolic function what is described as focal basal hypertrophy. He has mild dyspnea on exertion which has not changed in frequency or severity. He is on low-dose beta blocker. Will continue to monitor this.

## 2015-12-24 NOTE — Progress Notes (Signed)
12/24/2015 Albert Alvarez   May 18, 1951  [id number]  Primary Physician Jerlyn Ly, MD Primary Cardiologist: Lorretta Harp MD Renae Gloss  HPI:  Mr. Twiggs is a very pleasant 65 year old appearing married Caucasian male father of 24, grandfather to 6 grandchildren was formally a patient of Dr. Romeo Apple and  Dr. Peter Martinique who transferred his care to myself. His cardiovascular risk profile is entirely benign. He's never had a heart attack or stroke and denies chest pain. Does get some mild dyspnea on exertion. He had a 2-D echo performed several years ago that apparently showed asymmetrical septal hypertrophy with preserved ejection fraction. Likewise, a Myoview stress test which was normal and at that time an event monitor which did not show any arrhythmias. Since I saw him in the office 12/24/14, he has remained remarkably stable. He is fairly active and walks frequently complaining of some mild dyspnea on exertion . He denies chest pain. 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  He currently works as a Nature conservation officer. Of note, he has lost 18 pounds since I last saw him which he attributes to being on Weight Watchers and feels clinically improved.   Current Outpatient Prescriptions  Medication Sig Dispense Refill  . atenolol (TENORMIN) 25 MG tablet Take 1 tablet (25 mg total) by mouth daily. 30 tablet 11  . BIOFLAVONOID PRODUCTS PO Take 1,000 mg by mouth daily.    . Cholecalciferol (VITAMIN D PO) Take by mouth daily.      . Multiple Vitamin (MULTIVITAMIN) tablet Take 1 tablet by mouth daily.      . naproxen (NAPROSYN) 500 MG tablet Take 500 mg by mouth daily.       No current facility-administered medications for this visit.    Allergies  Allergen Reactions  . Codeine     Social History   Social History  . Marital Status: Married    Spouse Name: N/A  . Number of Children: 3  . Years of Education: N/A   Occupational History  . [employer]     . accountant     [place]   Social History Main Topics  . Smoking status: Never Smoker   . Smokeless tobacco: Never Used  . Alcohol Use: No  . Drug Use: Not on file  . Sexual Activity: Yes   Other Topics Concern  . Not on file   Social History Narrative     Review of Systems: General: negative for chills, fever, night sweats or weight changes.  Cardiovascular: negative for chest pain, dyspnea on exertion, edema, orthopnea, palpitations, paroxysmal nocturnal dyspnea or shortness of breath Dermatological: negative for rash Respiratory: negative for cough or wheezing Urologic: negative for hematuria Abdominal: negative for nausea, vomiting, diarrhea, bright red blood per rectum, melena, or hematemesis Neurologic: negative for visual changes, syncope, or dizziness All other systems reviewed and are otherwise negative except as noted above.    Blood pressure 122/74, pulse 54, height 5\' 10"  (1.778 m), weight 185 lb (83.915 kg).  General appearance: alert and no distress Neck: no adenopathy, no carotid bruit, no JVD, supple, symmetrical, trachea midline and thyroid not enlarged, symmetric, no tenderness/mass/nodules Lungs: clear to auscultation bilaterally Heart: regular rate and rhythm, S1, S2 normal, no murmur, click, rub or gallop Extremities: extremities normal, atraumatic, no cyanosis or edema  EKG sinus bradycardia at 54 without ST or T-wave changes. I personally reviewed this EKG  ASSESSMENT AND PLAN:   Hypertrophic cardiomyopathy  History of hypertrophic myopathy with his last echo performed 12/03/13 revealing normal LV systolic function what is described as focal basal hypertrophy. He has mild dyspnea on exertion which has not changed in frequency or severity. He is on low-dose beta blocker. Will continue to monitor this.      Lorretta Harp MD FACP,FACC,FAHA, [facility], [place] 12/24/2015 9:43 AM

## 2015-12-26 ENCOUNTER — Other Ambulatory Visit: Payer: Self-pay | Admitting: Cardiovascular Disease

## 2016-01-27 ENCOUNTER — Encounter: Payer: Self-pay | Admitting: Cardiovascular Disease

## 2016-02-23 ENCOUNTER — Other Ambulatory Visit: Payer: Self-pay | Admitting: *Deleted

## 2016-02-23 NOTE — Telephone Encounter (Signed)
Patient called and stated that his luggage was lost by the airline. He requested a two day supply of the atenolol be sent to the pharmacy where is is currently.

## 2016-02-24 ENCOUNTER — Other Ambulatory Visit: Payer: Self-pay

## 2016-02-24 NOTE — Telephone Encounter (Signed)
Spoke to pt he did not need any meds at this time

## 2016-09-22 ENCOUNTER — Other Ambulatory Visit: Payer: Self-pay | Admitting: Cardiovascular Disease

## 2016-09-22 NOTE — Telephone Encounter (Signed)
Rx(s) sent to pharmacy electronically.  

## 2016-12-14 ENCOUNTER — Other Ambulatory Visit: Payer: Self-pay | Admitting: Cardiovascular Disease

## 2017-01-13 ENCOUNTER — Other Ambulatory Visit: Payer: Self-pay | Admitting: Cardiovascular Disease

## 2017-02-22 ENCOUNTER — Ambulatory Visit: Payer: 59 | Admitting: Cardiovascular Disease

## 2017-03-10 ENCOUNTER — Other Ambulatory Visit: Payer: Self-pay | Admitting: Cardiovascular Disease

## 2017-03-25 ENCOUNTER — Other Ambulatory Visit: Payer: Self-pay | Admitting: Cardiovascular Disease

## 2017-04-05 ENCOUNTER — Encounter: Payer: Self-pay | Admitting: Cardiovascular Disease

## 2017-04-05 ENCOUNTER — Ambulatory Visit (INDEPENDENT_AMBULATORY_CARE_PROVIDER_SITE_OTHER): Payer: 59 | Admitting: Cardiovascular Disease

## 2017-04-05 VITALS — BP 128/82 | HR 53 | Ht 70.0 in | Wt 199.2 lb

## 2017-04-05 DIAGNOSIS — I422 Other hypertrophic cardiomyopathy: Secondary | ICD-10-CM

## 2017-04-05 MED ORDER — ATENOLOL 25 MG PO TABS
25.0000 mg | ORAL_TABLET | Freq: Every day | ORAL | 3 refills | Status: DC
Start: 2017-04-05 — End: 2018-02-22

## 2017-04-05 NOTE — Addendum Note (Signed)
Addended by: Therisa Doyne on: 04/05/2017 09:04 AM   Modules accepted: Orders

## 2017-04-05 NOTE — Assessment & Plan Note (Signed)
History of hypertrophic cardiomyopathy with 2-D echo performed 12/03/13 revealing normal LV systolic function with focal basal hypertrophy and moderately dilated right ventricle. I'm going to repeat a 2-D echocardiogram.

## 2017-04-05 NOTE — Progress Notes (Addendum)
04/05/2017 Albert Alvarez   10-Feb-1951  [id number]  Primary Physician [name], MD Primary Cardiologist: Lorretta Harp MD Lupe Carney, [place]  HPI:  Albert Alvarez is a 66 y.o. male married Caucasian male father of 50, grandfather to 6 grandchildren was formally a patient of Dr. Romeo Apple and  Dr. Peter Martinique who transferred his care to myself. His cardiovascular risk profile is entirely benign. He's never had a heart attack or stroke and denies chest pain. Does get some mild dyspnea on exertion. He had a 2-D echo performed several years ago that apparently showed asymmetrical septal hypertrophy with preserved ejection fraction. Likewise, a Myoview stress test which was normal and at that time an event monitor which did not show any arrhythmias. Since I saw him in the office 12/24/15, he has remained remarkably stable. He is fairly active and walks frequently complaining of some mild dyspnea on exertion . He denies chest pain. 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  He currently works as a Nature conservation officer. He mentioned that he plans to retire in January of this coming year. Of note, he has lost 18 pounds since I last saw him which he attributes to being on Weight Watchers and feels clinically improved. Recent blood work provided by the patient and performed by his PCP 03/11/17 revealed total cholesterol 103, LDL 52 and HDL of 33.    Current Meds  Medication Sig  . atenolol (TENORMIN) 25 MG tablet TAKE ONE TABLET BY MOUTH DAILY **NEED APPOINTMENT FOR ADDITIONAL REFILLS**  . atorvastatin (LIPITOR) 10 MG tablet Take 1 tablet by mouth daily.  Marland Kitchen BIOFLAVONOID PRODUCTS PO Take 1,000 mg by mouth daily.  . Cholecalciferol (VITAMIN D PO) Take by mouth daily.    . Multiple Vitamin (MULTIVITAMIN) tablet Take 1 tablet by mouth daily.    . naproxen (NAPROSYN) 500 MG tablet Take 500 mg by mouth daily.       Allergies  Allergen Reactions  . Codeine     Social History    Social History  . Marital status: Married    Spouse name: N/A  . Number of children: 3  . Years of education: N/A   Occupational History  . [employer]WN[employer]  . accountant     [employer]   Social History Main Topics  . Smoking status: Never Smoker  . Smokeless tobacco: Never Used  . Alcohol use No  . Drug use: Unknown  . Sexual activity: Yes   Other Topics Concern  . Not on file   Social History Narrative  . No narrative on file     Review of Systems: General: negative for chills, fever, night sweats or weight changes.  Cardiovascular: negative for chest pain, dyspnea on exertion, edema, orthopnea, palpitations, paroxysmal nocturnal dyspnea or shortness of breath Dermatological: negative for rash Respiratory: negative for cough or wheezing Urologic: negative for hematuria Abdominal: negative for nausea, vomiting, diarrhea, bright red blood per rectum, melena, or hematemesis Neurologic: negative for visual changes, syncope, or dizziness All other systems reviewed and are otherwise negative except as noted above.    Blood pressure 128/82, pulse (!) 53, height 5\' 10"  (1.778 m), weight 199 lb 3.2 oz (90.4 kg).  General appearance: alert and no distress Neck: no adenopathy, no carotid bruit, no JVD, supple, symmetrical, trachea midline and thyroid not enlarged, symmetric, no tenderness/mass/nodules Lungs: clear to auscultation bilaterally Heart: regular rate and rhythm, S1, S2 normal, no murmur, click,  rub or gallop Extremities: extremities normal, atraumatic, no cyanosis or edema Pulses: 2+ and symmetric Skin: Skin color, texture, turgor normal. No rashes or lesions Neurologic: Alert and oriented X 3, normal strength and tone. Normal symmetric reflexes. Normal coordination and gait  EKG sinus bradycardia 53 without ST or T-wave changes. I personally reviewed this EKG  ASSESSMENT AND PLAN:   Hypertrophic cardiomyopathy History of hypertrophic cardiomyopathy with 2-D echo  performed 12/03/13 revealing normal LV systolic function with focal basal hypertrophy and moderately dilated right ventricle. I'm going to repeat a 2-D echocardiogram.      Lorretta Harp MD [facility], FSCAI 04/05/2017 9:00 AM

## 2017-04-05 NOTE — Patient Instructions (Signed)
Medication Instructions: Your physician recommends that you continue on your current medications as directed. Please refer to the Current Medication list given to you today.  Testing/Procedures: Your physician has requested that you have an echocardiogram. Echocardiography is a painless test that uses sound waves to create images of your heart. It provides your doctor with information about the size and shape of your heart and how well your heart's chambers and valves are working. This procedure takes approximately one hour. There are no restrictions for this procedure.  Follow-Up: Your physician wants you to follow-up in: 1 year with Dr. Berry. You will receive a reminder letter in the mail two months in advance. If you don't receive a letter, please call our office to schedule the follow-up appointment.  If you need a refill on your cardiac medications before your next appointment, please call your pharmacy.  

## 2017-04-15 ENCOUNTER — Other Ambulatory Visit: Payer: Self-pay

## 2017-04-15 ENCOUNTER — Ambulatory Visit (HOSPITAL_COMMUNITY): Payer: 59 | Attending: Internal Medicine

## 2017-04-15 DIAGNOSIS — I071 Rheumatic tricuspid insufficiency: Secondary | ICD-10-CM | POA: Diagnosis not present

## 2017-04-15 DIAGNOSIS — I371 Nonrheumatic pulmonary valve insufficiency: Secondary | ICD-10-CM | POA: Insufficient documentation

## 2017-04-15 DIAGNOSIS — I422 Other hypertrophic cardiomyopathy: Secondary | ICD-10-CM

## 2017-09-13 DIAGNOSIS — H4423 Degenerative myopia, bilateral: Secondary | ICD-10-CM | POA: Diagnosis not present

## 2017-09-13 DIAGNOSIS — H33011 Retinal detachment with single break, right eye: Secondary | ICD-10-CM | POA: Diagnosis not present

## 2017-09-13 DIAGNOSIS — H35373 Puckering of macula, bilateral: Secondary | ICD-10-CM | POA: Diagnosis not present

## 2017-10-06 DIAGNOSIS — N183 Chronic kidney disease, stage 3 (moderate): Secondary | ICD-10-CM | POA: Diagnosis not present

## 2017-10-06 DIAGNOSIS — Z23 Encounter for immunization: Secondary | ICD-10-CM | POA: Diagnosis not present

## 2017-10-11 ENCOUNTER — Other Ambulatory Visit: Payer: Self-pay | Admitting: Internal Medicine

## 2017-10-11 DIAGNOSIS — N183 Chronic kidney disease, stage 3 (moderate): Principal | ICD-10-CM

## 2017-10-11 DIAGNOSIS — I130 Hypertensive heart and chronic kidney disease with heart failure and stage 1 through stage 4 chronic kidney disease, or unspecified chronic kidney disease: Secondary | ICD-10-CM

## 2017-10-20 ENCOUNTER — Ambulatory Visit
Admission: RE | Admit: 2017-10-20 | Discharge: 2017-10-20 | Disposition: A | Discharge status: Home / Self Care | Payer: Medicare Other | Source: Ambulatory Visit | Attending: Internal Medicine | Admitting: Internal Medicine

## 2017-10-20 DIAGNOSIS — N183 Chronic kidney disease, stage 3 unspecified: Secondary | ICD-10-CM

## 2017-10-20 DIAGNOSIS — I129 Hypertensive chronic kidney disease with stage 1 through stage 4 chronic kidney disease, or unspecified chronic kidney disease: Secondary | ICD-10-CM | POA: Diagnosis not present

## 2017-10-20 DIAGNOSIS — I130 Hypertensive heart and chronic kidney disease with heart failure and stage 1 through stage 4 chronic kidney disease, or unspecified chronic kidney disease: Secondary | ICD-10-CM

## 2018-02-17 DIAGNOSIS — H0015 Chalazion left lower eyelid: Secondary | ICD-10-CM | POA: Diagnosis not present

## 2018-02-22 ENCOUNTER — Other Ambulatory Visit: Payer: Self-pay | Admitting: Cardiovascular Disease

## 2018-02-23 DIAGNOSIS — L821 Other seborrheic keratosis: Secondary | ICD-10-CM | POA: Diagnosis not present

## 2018-02-23 DIAGNOSIS — D225 Melanocytic nevi of trunk: Secondary | ICD-10-CM | POA: Diagnosis not present

## 2018-02-23 DIAGNOSIS — L820 Inflamed seborrheic keratosis: Secondary | ICD-10-CM | POA: Diagnosis not present

## 2018-02-23 DIAGNOSIS — L812 Freckles: Secondary | ICD-10-CM | POA: Diagnosis not present

## 2018-02-23 DIAGNOSIS — D485 Neoplasm of uncertain behavior of skin: Secondary | ICD-10-CM | POA: Diagnosis not present

## 2018-02-23 DIAGNOSIS — D1801 Hemangioma of skin and subcutaneous tissue: Secondary | ICD-10-CM | POA: Diagnosis not present

## 2018-03-23 DIAGNOSIS — R82998 Other abnormal findings in urine: Secondary | ICD-10-CM | POA: Diagnosis not present

## 2018-03-23 DIAGNOSIS — N183 Chronic kidney disease, stage 3 (moderate): Secondary | ICD-10-CM | POA: Diagnosis not present

## 2018-03-24 DIAGNOSIS — E7849 Other hyperlipidemia: Secondary | ICD-10-CM | POA: Diagnosis not present

## 2018-03-24 DIAGNOSIS — N183 Chronic kidney disease, stage 3 (moderate): Secondary | ICD-10-CM | POA: Diagnosis not present

## 2018-03-24 DIAGNOSIS — Z125 Encounter for screening for malignant neoplasm of prostate: Secondary | ICD-10-CM | POA: Diagnosis not present

## 2018-03-30 DIAGNOSIS — M5136 Other intervertebral disc degeneration, lumbar region: Secondary | ICD-10-CM | POA: Diagnosis not present

## 2018-03-30 DIAGNOSIS — D7589 Other specified diseases of blood and blood-forming organs: Secondary | ICD-10-CM | POA: Diagnosis not present

## 2018-03-30 DIAGNOSIS — Z1389 Encounter for screening for other disorder: Secondary | ICD-10-CM | POA: Diagnosis not present

## 2018-03-30 DIAGNOSIS — Z6826 Body mass index (BMI) 26.0-26.9, adult: Secondary | ICD-10-CM | POA: Diagnosis not present

## 2018-03-30 DIAGNOSIS — I422 Other hypertrophic cardiomyopathy: Secondary | ICD-10-CM | POA: Diagnosis not present

## 2018-03-30 DIAGNOSIS — Z0000 Encounter for general adult medical examination without abnormal findings: Secondary | ICD-10-CM | POA: Diagnosis not present

## 2018-03-30 DIAGNOSIS — E7849 Other hyperlipidemia: Secondary | ICD-10-CM | POA: Diagnosis not present

## 2018-03-30 DIAGNOSIS — M5030 Other cervical disc degeneration, unspecified cervical region: Secondary | ICD-10-CM | POA: Diagnosis not present

## 2018-03-30 DIAGNOSIS — M6530 Trigger finger, unspecified finger: Secondary | ICD-10-CM | POA: Diagnosis not present

## 2018-03-30 DIAGNOSIS — Z23 Encounter for immunization: Secondary | ICD-10-CM | POA: Diagnosis not present

## 2018-03-30 DIAGNOSIS — N183 Chronic kidney disease, stage 3 (moderate): Secondary | ICD-10-CM | POA: Diagnosis not present

## 2018-04-01 ENCOUNTER — Other Ambulatory Visit: Payer: Self-pay | Admitting: Internal Medicine

## 2018-04-01 DIAGNOSIS — M5030 Other cervical disc degeneration, unspecified cervical region: Secondary | ICD-10-CM

## 2018-04-01 DIAGNOSIS — E785 Hyperlipidemia, unspecified: Secondary | ICD-10-CM

## 2018-04-07 ENCOUNTER — Ambulatory Visit
Admission: RE | Admit: 2018-04-07 | Discharge: 2018-04-07 | Disposition: A | Discharge status: Home / Self Care | Payer: Medicare Other | Source: Ambulatory Visit | Attending: Internal Medicine | Admitting: Internal Medicine

## 2018-04-07 DIAGNOSIS — E785 Hyperlipidemia, unspecified: Secondary | ICD-10-CM

## 2018-04-07 DIAGNOSIS — I2510 Atherosclerotic heart disease of native coronary artery without angina pectoris: Secondary | ICD-10-CM | POA: Diagnosis not present

## 2018-04-10 ENCOUNTER — Ambulatory Visit
Admission: RE | Admit: 2018-04-10 | Discharge: 2018-04-10 | Disposition: A | Discharge status: Home / Self Care | Payer: Medicare Other | Source: Ambulatory Visit | Attending: Internal Medicine | Admitting: Internal Medicine

## 2018-04-10 DIAGNOSIS — M5030 Other cervical disc degeneration, unspecified cervical region: Secondary | ICD-10-CM

## 2018-04-10 DIAGNOSIS — M50223 Other cervical disc displacement at C6-C7 level: Secondary | ICD-10-CM | POA: Diagnosis not present

## 2018-04-21 DIAGNOSIS — M65332 Trigger finger, left middle finger: Secondary | ICD-10-CM | POA: Diagnosis not present

## 2018-04-21 DIAGNOSIS — M65341 Trigger finger, right ring finger: Secondary | ICD-10-CM | POA: Diagnosis not present

## 2018-04-21 DIAGNOSIS — Z1212 Encounter for screening for malignant neoplasm of rectum: Secondary | ICD-10-CM | POA: Diagnosis not present

## 2018-05-11 DIAGNOSIS — R030 Elevated blood-pressure reading, without diagnosis of hypertension: Secondary | ICD-10-CM | POA: Diagnosis not present

## 2018-05-11 DIAGNOSIS — R200 Anesthesia of skin: Secondary | ICD-10-CM | POA: Diagnosis not present

## 2018-05-11 DIAGNOSIS — Z6828 Body mass index (BMI) 28.0-28.9, adult: Secondary | ICD-10-CM | POA: Diagnosis not present

## 2018-05-11 DIAGNOSIS — R202 Paresthesia of skin: Secondary | ICD-10-CM | POA: Diagnosis not present

## 2018-05-21 ENCOUNTER — Other Ambulatory Visit: Payer: Self-pay | Admitting: Cardiovascular Disease

## 2018-05-22 DIAGNOSIS — R200 Anesthesia of skin: Secondary | ICD-10-CM | POA: Diagnosis not present

## 2018-05-22 DIAGNOSIS — M4802 Spinal stenosis, cervical region: Secondary | ICD-10-CM | POA: Diagnosis not present

## 2018-05-22 NOTE — Telephone Encounter (Signed)
Rx request sent to pharmacy.  

## 2018-06-20 ENCOUNTER — Other Ambulatory Visit: Payer: Self-pay | Admitting: Cardiovascular Disease

## 2018-07-07 ENCOUNTER — Ambulatory Visit (INDEPENDENT_AMBULATORY_CARE_PROVIDER_SITE_OTHER): Payer: Medicare Other | Admitting: Cardiovascular Disease

## 2018-07-07 ENCOUNTER — Encounter: Payer: Self-pay | Admitting: Cardiovascular Disease

## 2018-07-07 VITALS — BP 122/72 | HR 59 | Ht 70.0 in | Wt 194.2 lb

## 2018-07-07 DIAGNOSIS — I422 Other hypertrophic cardiomyopathy: Secondary | ICD-10-CM | POA: Diagnosis not present

## 2018-07-07 DIAGNOSIS — E785 Hyperlipidemia, unspecified: Secondary | ICD-10-CM | POA: Insufficient documentation

## 2018-07-07 DIAGNOSIS — E782 Mixed hyperlipidemia: Secondary | ICD-10-CM | POA: Diagnosis not present

## 2018-07-07 NOTE — Assessment & Plan Note (Signed)
History of hyperlipidemia on low-dose Crestor with lipid profile performed 03/24/2018 revealing total cholesterol 163, LDL of 104 and HDL of 35.

## 2018-07-07 NOTE — Patient Instructions (Signed)
Medication Instructions:  NONE If you need a refill on your cardiac medications before your next appointment, please call your pharmacy.   Lab work: NONE If you have labs (blood work) drawn today and your tests are completely normal, you will receive your results only by: . MyChart Message (if you have MyChart) OR . A paper copy in the mail If you have any lab test that is abnormal or we need to change your treatment, we will call you to review the results.  Testing/Procedures: NONE  Follow-Up: At [facility], you and your health needs are our priority.  As part of our continuing mission to provide you with exceptional heart care, we have created designated Provider Care Teams.  These Care Teams include your primary Cardiologist (physician) and Advanced Practice Providers (APPs -  Physician Assistants and Nurse Practitioners) who all work together to provide you with the care you need, when you need it. . You will need a follow up appointment in 12 months.  Please call our office 2 months in advance to schedule this appointment.  You may see Dr. Berry or one of the following Advanced Practice Providers on your designated Care Team:   . Luke Kilroy, PA-C . Hao Meng, PA-C . Angela Duke, PA-C . Kathryn Lawrence, DNP . Rhonda Barrett, PA-C . Krista Kroeger, PA-C . Callie Goodrich, PA-C   

## 2018-07-07 NOTE — Assessment & Plan Note (Signed)
History of asymptomatic hypertrophic cardiomyopathy asymmetric septal thickening.  His last echo performed 04/15/2017 showed no change compared to an echo performed in 2015.  There was not an outflow tract gradient.  His EF was normal.  He has no symptoms from this.

## 2018-07-07 NOTE — Progress Notes (Signed)
07/07/2018 Albert Alvarez   1951/02/04  [id number]  Primary Physician [name], MD Primary Cardiologist: Lorretta Harp MD Lupe Carney, [place]  HPI:  Albert Alvarez is a 68 y.o.  married Caucasian male father of 51, grandfather to 6 grandchildren was formally a patient of Dr. Romeo Apple and Dr. Peter Martinique who transferred his care to myself. I last saw him in the office 04/05/2017 his cardiovascular risk profile is entirely benign. He's never had a heart attack or stroke and denies chest pain. Does get some mild dyspnea on exertion. He had a 2-D echo performed several years ago that apparently showed asymmetrical septal hypertrophy with preserved ejection fraction. Likewise, a Myoview stress test which was normal and at that time an event monitor which did not show any arrhythmias. Since I saw him in the office 12/24/15, he has remained remarkably stable. He is fairly active and walks frequently complaining of some mild dyspnea on exertion . He denies chest pain. 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 He was the [employer] until last year now he works near 2 days a week as a Optometrist. Of note, he has lost 18 pounds since I last saw him which he attributes to being on Weight Watchers and feels clinically improved. Recent blood performed 03/24/2018 revealed total cholesterol 163, LDL 104 and HDL 35.  His Lipitor was changed to rosuvastatin.  He walks 3 to 4 miles a day 6 days a week and is otherwise asymptomatic.  Interestingly, Dr. Joylene Draft, his PCP, ordered a coronary calcium score 04/07/2018 which was 251 with calcium in the LAD territory.  Current Meds  Medication Sig  . atenolol (TENORMIN) 25 MG tablet Take 1 tablet (25 mg total) by mouth daily. Please schedule appt for future refills. 2nd attempt.  Marland Kitchen BIOFLAVONOID PRODUCTS PO Take 1,000 mg by mouth daily.  . Cholecalciferol (VITAMIN D PO) Take by mouth daily.    . Multiple Vitamin (MULTIVITAMIN) tablet Take 1 tablet  by mouth daily.    . naproxen (NAPROSYN) 500 MG tablet Take 500 mg by mouth daily.    . [DISCONTINUED] atorvastatin (LIPITOR) 10 MG tablet Take 1 tablet by mouth daily.     Allergies  Allergen Reactions  . Codeine     Social History   Socioeconomic History  . Marital status: Married    Spouse name: Not on file  . Number of children: 3  . Years of education: Not on file  . Highest education level: Not on file  Occupational History  . 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 Occupation: Optometrist    Comment: [place]  . Financial resource strain: Not on file  . Food insecurity:    Worry: Not on file    Inability: Not on file  . Transportation needs:    Medical: Not on file    Non-medical: Not on file  Tobacco Use  . Smoking status: Never Smoker  . Smokeless tobacco: Never Used  Substance and Sexual Activity  . Alcohol use: No  . Drug use: Not on file  . Sexual activity: Yes  Lifestyle  . Physical activity:    Days per week: Not on file    Minutes per session: Not on file  . Stress: Not on file  Relationships  . Social connections:    Talks on phone: Not on file    Gets together: Not on file  Attends religious service: Not on file    Active member of club or organization: Not on file    Attends meetings of clubs or organizations: Not on file    Relationship status: Not on file  . Intimate partner violence:    Fear of current or ex partner: Not on file    Emotionally abused: Not on file    Physically abused: Not on file    Forced sexual activity: Not on file  Other Topics Concern  . Not on file  Social History Narrative  . Not on file     Review of Systems: General: negative for chills, fever, night sweats or weight changes.  Cardiovascular: negative for chest pain, dyspnea on exertion, edema, orthopnea, palpitations, paroxysmal nocturnal dyspnea or shortness of breath Dermatological: negative for rash Respiratory: negative for cough or  wheezing Urologic: negative for hematuria Abdominal: negative for nausea, vomiting, diarrhea, bright red blood per rectum, melena, or hematemesis Neurologic: negative for visual changes, syncope, or dizziness All other systems reviewed and are otherwise negative except as noted above.    Blood pressure 122/72, pulse (!) 59, height 5\' 10"  (1.778 m), weight 194 lb 3.2 oz (88.1 kg).  General appearance: alert and no distress Neck: no adenopathy, no carotid bruit, no JVD, supple, symmetrical, trachea midline and thyroid not enlarged, symmetric, no tenderness/mass/nodules Lungs: clear to auscultation bilaterally Heart: regular rate and rhythm, S1, S2 normal, no murmur, click, rub or gallop Extremities: extremities normal, atraumatic, no cyanosis or edema Pulses: 2+ and symmetric Skin: Skin color, texture, turgor normal. No rashes or lesions Neurologic: Alert and oriented X 3, normal strength and tone. Normal symmetric reflexes. Normal coordination and gait  EKG sinus bradycardia 59 with borderline criteria for LVH.  I personally reviewed this EKG.  ASSESSMENT AND PLAN:   Hyperlipidemia History of hyperlipidemia on low-dose Crestor with lipid profile performed 03/24/2018 revealing total cholesterol 163, LDL of 104 and HDL of 35.  Hypertrophic cardiomyopathy History of asymptomatic hypertrophic cardiomyopathy asymmetric septal thickening.  His last echo performed 04/15/2017 showed no change compared to an echo performed in 2015.  There was not an outflow tract gradient.  His EF was normal.  He has no symptoms from this.      Lorretta Harp MD FACP,FACC,FAHA, [facility] 07/07/2018 9:20 AM

## 2018-07-19 ENCOUNTER — Other Ambulatory Visit: Payer: Self-pay | Admitting: Cardiovascular Disease

## 2018-12-19 DIAGNOSIS — H35342 Macular cyst, hole, or pseudohole, left eye: Secondary | ICD-10-CM | POA: Diagnosis not present

## 2018-12-19 DIAGNOSIS — H442E3 Degenerative myopia with other maculopathy, bilateral eye: Secondary | ICD-10-CM | POA: Diagnosis not present

## 2018-12-19 DIAGNOSIS — Z961 Presence of intraocular lens: Secondary | ICD-10-CM | POA: Diagnosis not present

## 2018-12-19 DIAGNOSIS — H442A2 Degenerative myopia with choroidal neovascularization, left eye: Secondary | ICD-10-CM | POA: Diagnosis not present

## 2018-12-19 DIAGNOSIS — H35372 Puckering of macula, left eye: Secondary | ICD-10-CM | POA: Diagnosis not present

## 2019-03-06 DIAGNOSIS — B356 Tinea cruris: Secondary | ICD-10-CM | POA: Diagnosis not present

## 2019-03-06 DIAGNOSIS — D225 Melanocytic nevi of trunk: Secondary | ICD-10-CM | POA: Diagnosis not present

## 2019-03-06 DIAGNOSIS — L820 Inflamed seborrheic keratosis: Secondary | ICD-10-CM | POA: Diagnosis not present

## 2019-03-06 DIAGNOSIS — D1801 Hemangioma of skin and subcutaneous tissue: Secondary | ICD-10-CM | POA: Diagnosis not present

## 2019-03-06 DIAGNOSIS — L821 Other seborrheic keratosis: Secondary | ICD-10-CM | POA: Diagnosis not present

## 2019-03-06 DIAGNOSIS — L812 Freckles: Secondary | ICD-10-CM | POA: Diagnosis not present

## 2019-03-09 DIAGNOSIS — M65332 Trigger finger, left middle finger: Secondary | ICD-10-CM | POA: Diagnosis not present

## 2019-03-09 DIAGNOSIS — M65341 Trigger finger, right ring finger: Secondary | ICD-10-CM | POA: Diagnosis not present

## 2019-03-26 DIAGNOSIS — Z23 Encounter for immunization: Secondary | ICD-10-CM | POA: Diagnosis not present

## 2019-04-27 DIAGNOSIS — M65332 Trigger finger, left middle finger: Secondary | ICD-10-CM | POA: Diagnosis not present

## 2019-04-27 DIAGNOSIS — M65341 Trigger finger, right ring finger: Secondary | ICD-10-CM | POA: Diagnosis not present

## 2019-04-30 ENCOUNTER — Other Ambulatory Visit: Payer: Self-pay | Admitting: Orthopedic Surgery

## 2019-05-14 ENCOUNTER — Other Ambulatory Visit: Payer: Self-pay

## 2019-05-14 ENCOUNTER — Encounter (HOSPITAL_BASED_OUTPATIENT_CLINIC_OR_DEPARTMENT_OTHER): Payer: Self-pay | Admitting: *Deleted

## 2019-05-14 DIAGNOSIS — D485 Neoplasm of uncertain behavior of skin: Secondary | ICD-10-CM | POA: Diagnosis not present

## 2019-05-14 DIAGNOSIS — L821 Other seborrheic keratosis: Secondary | ICD-10-CM | POA: Diagnosis not present

## 2019-05-14 DIAGNOSIS — L820 Inflamed seborrheic keratosis: Secondary | ICD-10-CM | POA: Diagnosis not present

## 2019-05-15 DIAGNOSIS — E7849 Other hyperlipidemia: Secondary | ICD-10-CM | POA: Diagnosis not present

## 2019-05-15 DIAGNOSIS — Z125 Encounter for screening for malignant neoplasm of prostate: Secondary | ICD-10-CM | POA: Diagnosis not present

## 2019-05-18 ENCOUNTER — Other Ambulatory Visit (HOSPITAL_COMMUNITY)
Admission: RE | Admit: 2019-05-18 | Discharge: 2019-05-18 | Disposition: A | Discharge status: Home / Self Care | Payer: Medicare Other | Source: Ambulatory Visit | Attending: Orthopedic Surgery | Admitting: Orthopedic Surgery

## 2019-05-18 DIAGNOSIS — R82998 Other abnormal findings in urine: Secondary | ICD-10-CM | POA: Diagnosis not present

## 2019-05-18 DIAGNOSIS — Z01812 Encounter for preprocedural laboratory examination: Secondary | ICD-10-CM | POA: Diagnosis present

## 2019-05-18 DIAGNOSIS — Z20828 Contact with and (suspected) exposure to other viral communicable diseases: Secondary | ICD-10-CM | POA: Insufficient documentation

## 2019-05-18 NOTE — Progress Notes (Signed)

## 2019-05-19 LAB — NOVEL CORONAVIRUS, NAA (HOSP ORDER, SEND-OUT TO REF LAB; TAT 18-24 HRS): SARS-CoV-2, NAA: NOT DETECTED

## 2019-05-22 ENCOUNTER — Encounter (HOSPITAL_BASED_OUTPATIENT_CLINIC_OR_DEPARTMENT_OTHER): Payer: Self-pay | Admitting: Orthopedic Surgery

## 2019-05-22 ENCOUNTER — Ambulatory Visit (HOSPITAL_BASED_OUTPATIENT_CLINIC_OR_DEPARTMENT_OTHER): Payer: Medicare Other | Admitting: Anesthesiology

## 2019-05-22 ENCOUNTER — Ambulatory Visit (HOSPITAL_BASED_OUTPATIENT_CLINIC_OR_DEPARTMENT_OTHER)
Admission: RE | Admit: 2019-05-22 | Discharge: 2019-05-22 | Disposition: A | Discharge status: Home / Self Care | Payer: Medicare Other | Attending: Orthopedic Surgery | Admitting: Orthopedic Surgery

## 2019-05-22 ENCOUNTER — Other Ambulatory Visit: Payer: Self-pay

## 2019-05-22 ENCOUNTER — Encounter (HOSPITAL_BASED_OUTPATIENT_CLINIC_OR_DEPARTMENT_OTHER)
Admission: RE | Disposition: A | Discharge status: Home / Self Care | Payer: Self-pay | Source: Home / Self Care | Attending: Orthopedic Surgery

## 2019-05-22 DIAGNOSIS — M65332 Trigger finger, left middle finger: Secondary | ICD-10-CM | POA: Insufficient documentation

## 2019-05-22 DIAGNOSIS — I10 Essential (primary) hypertension: Secondary | ICD-10-CM | POA: Diagnosis not present

## 2019-05-22 DIAGNOSIS — M65341 Trigger finger, right ring finger: Secondary | ICD-10-CM | POA: Insufficient documentation

## 2019-05-22 HISTORY — PX: TRIGGER FINGER RELEASE: SHX641

## 2019-05-22 SURGERY — RELEASE, A1 PULLEY, FOR TRIGGER FINGER
Anesthesia: Monitor Anesthesia Care | Site: Hand | Laterality: Right

## 2019-05-22 MED ORDER — MIDAZOLAM HCL 2 MG/2ML IJ SOLN
INTRAMUSCULAR | Status: AC
  Filled 2019-05-22: qty 2

## 2019-05-22 MED ORDER — FENTANYL CITRATE (PF) 100 MCG/2ML IJ SOLN
INTRAMUSCULAR | Status: DC | PRN
  Administered 2019-05-22: 100 ug via INTRAVENOUS

## 2019-05-22 MED ORDER — LACTATED RINGERS IV SOLN: INTRAVENOUS | Status: DC

## 2019-05-22 MED ORDER — MIDAZOLAM HCL 2 MG/2ML IJ SOLN: 1.0000 mg | INTRAMUSCULAR | Status: DC | PRN

## 2019-05-22 MED ORDER — OXYCODONE HCL 5 MG PO TABS: 5.0000 mg | ORAL_TABLET | Freq: Once | ORAL | Status: DC | PRN

## 2019-05-22 MED ORDER — ONDANSETRON HCL 4 MG/2ML IJ SOLN: 4.0000 mg | Freq: Once | INTRAMUSCULAR | Status: DC | PRN

## 2019-05-22 MED ORDER — BUPIVACAINE HCL (PF) 0.25 % IJ SOLN
INTRAMUSCULAR | Status: DC | PRN
  Administered 2019-05-22: 7 mL

## 2019-05-22 MED ORDER — CEFAZOLIN SODIUM-DEXTROSE 2-4 GM/100ML-% IV SOLN
INTRAVENOUS | Status: AC
  Filled 2019-05-22: qty 100

## 2019-05-22 MED ORDER — MIDAZOLAM HCL 5 MG/5ML IJ SOLN
INTRAMUSCULAR | Status: DC | PRN
  Administered 2019-05-22: 2 mg via INTRAVENOUS

## 2019-05-22 MED ORDER — CEFAZOLIN SODIUM 1 G IJ SOLR
INTRAMUSCULAR | Status: AC
  Filled 2019-05-22: qty 20

## 2019-05-22 MED ORDER — LIDOCAINE 2% (20 MG/ML) 5 ML SYRINGE
INTRAMUSCULAR | Status: AC
  Filled 2019-05-22: qty 5

## 2019-05-22 MED ORDER — TRAMADOL HCL 50 MG PO TABS: 50.0000 mg | ORAL_TABLET | Freq: Four times a day (QID) | ORAL | 0 refills | Status: DC | PRN

## 2019-05-22 MED ORDER — ONDANSETRON HCL 4 MG/2ML IJ SOLN
INTRAMUSCULAR | Status: DC | PRN
  Administered 2019-05-22: 4 mg via INTRAVENOUS

## 2019-05-22 MED ORDER — FENTANYL CITRATE (PF) 100 MCG/2ML IJ SOLN: 50.0000 ug | INTRAMUSCULAR | Status: DC | PRN

## 2019-05-22 MED ORDER — CHLORHEXIDINE GLUCONATE 4 % EX LIQD: 60.0000 mL | Freq: Once | CUTANEOUS | Status: DC

## 2019-05-22 MED ORDER — MEPERIDINE HCL 25 MG/ML IJ SOLN: 6.2500 mg | INTRAMUSCULAR | Status: DC | PRN

## 2019-05-22 MED ORDER — ACETAMINOPHEN 160 MG/5ML PO SOLN: 325.0000 mg | ORAL | Status: DC | PRN

## 2019-05-22 MED ORDER — FENTANYL CITRATE (PF) 100 MCG/2ML IJ SOLN
INTRAMUSCULAR | Status: AC
  Filled 2019-05-22: qty 2

## 2019-05-22 MED ORDER — LIDOCAINE HCL (PF) 0.5 % IJ SOLN
INTRAMUSCULAR | Status: DC | PRN
  Administered 2019-05-22: 35 mL via INTRAVENOUS

## 2019-05-22 MED ORDER — ACETAMINOPHEN 325 MG PO TABS: 325.0000 mg | ORAL_TABLET | ORAL | Status: DC | PRN

## 2019-05-22 MED ORDER — OXYCODONE HCL 5 MG/5ML PO SOLN: 5.0000 mg | Freq: Once | ORAL | Status: DC | PRN

## 2019-05-22 MED ORDER — FENTANYL CITRATE (PF) 100 MCG/2ML IJ SOLN: 25.0000 ug | INTRAMUSCULAR | Status: DC | PRN

## 2019-05-22 MED ORDER — ONDANSETRON HCL 4 MG/2ML IJ SOLN
INTRAMUSCULAR | Status: AC
  Filled 2019-05-22: qty 2

## 2019-05-22 MED ORDER — PROPOFOL 500 MG/50ML IV EMUL
INTRAVENOUS | Status: DC | PRN
  Administered 2019-05-22: 75 ug/kg/min via INTRAVENOUS

## 2019-05-22 MED ORDER — CEFAZOLIN SODIUM-DEXTROSE 2-4 GM/100ML-% IV SOLN
2.0000 g | INTRAVENOUS | Status: AC
  Administered 2019-05-22: 2 g via INTRAVENOUS

## 2019-05-22 MED ORDER — DEXMEDETOMIDINE HCL IN NACL 200 MCG/50ML IV SOLN
INTRAVENOUS | Status: AC
  Filled 2019-05-22: qty 50

## 2019-05-22 MED ORDER — PROPOFOL 10 MG/ML IV BOLUS
INTRAVENOUS | Status: DC | PRN
  Administered 2019-05-22: 30 mg via INTRAVENOUS

## 2019-05-22 SURGICAL SUPPLY — 35 items
BLADE SURG 15 STRL LF DISP TIS (BLADE) ×1 IMPLANT
BLADE SURG 15 STRL SS (BLADE) ×2
BNDG COHESIVE 2X5 TAN STRL LF (GAUZE/BANDAGES/DRESSINGS) ×3 IMPLANT
BNDG COHESIVE 3X5 TAN STRL LF (GAUZE/BANDAGES/DRESSINGS) ×3 IMPLANT
BNDG ELASTIC 3X5.8 VLCR STR LF (GAUZE/BANDAGES/DRESSINGS) ×3 IMPLANT
BNDG ESMARK 4X9 LF (GAUZE/BANDAGES/DRESSINGS) IMPLANT
CHLORAPREP W/TINT 26 (MISCELLANEOUS) ×3 IMPLANT
CORD BIPOLAR FORCEPS 12FT (ELECTRODE) IMPLANT
COVER BACK TABLE REUSABLE LG (DRAPES) ×3 IMPLANT
COVER MAYO STAND REUSABLE (DRAPES) ×3 IMPLANT
COVER WAND RF STERILE (DRAPES) IMPLANT
CUFF TOURN SGL QUICK 18X4 (TOURNIQUET CUFF) IMPLANT
DECANTER SPIKE VIAL GLASS SM (MISCELLANEOUS) IMPLANT
DRAPE EXTREMITY T 121X128X90 (DISPOSABLE) ×3 IMPLANT
DRAPE SURG 17X23 STRL (DRAPES) ×3 IMPLANT
GAUZE SPONGE 4X4 12PLY STRL (GAUZE/BANDAGES/DRESSINGS) ×3 IMPLANT
GAUZE SPONGE 4X4 12PLY STRL LF (GAUZE/BANDAGES/DRESSINGS) ×3 IMPLANT
GAUZE XEROFORM 1X8 LF (GAUZE/BANDAGES/DRESSINGS) ×3 IMPLANT
GLOVE BIOGEL PI IND STRL 8.5 (GLOVE) ×1 IMPLANT
GLOVE BIOGEL PI INDICATOR 8.5 (GLOVE) ×2
GLOVE SURG ORTHO 8.0 STRL STRW (GLOVE) ×3 IMPLANT
GOWN STRL REUS W/ TWL LRG LVL3 (GOWN DISPOSABLE) ×1 IMPLANT
GOWN STRL REUS W/TWL LRG LVL3 (GOWN DISPOSABLE) ×2
GOWN STRL REUS W/TWL XL LVL3 (GOWN DISPOSABLE) ×3 IMPLANT
NEEDLE PRECISIONGLIDE 27X1.5 (NEEDLE) ×3 IMPLANT
NS IRRIG 1000ML POUR BTL (IV SOLUTION) ×3 IMPLANT
PACK BASIN DAY SURGERY FS (CUSTOM PROCEDURE TRAY) ×3 IMPLANT
PAD CAST 3X4 CTTN HI CHSV (CAST SUPPLIES) ×1 IMPLANT
PADDING CAST COTTON 3X4 STRL (CAST SUPPLIES) ×2
STOCKINETTE 4X48 STRL (DRAPES) ×3 IMPLANT
SUT ETHILON 4 0 PS 2 18 (SUTURE) ×3 IMPLANT
SYR BULB 3OZ (MISCELLANEOUS) ×3 IMPLANT
SYR CONTROL 10ML LL (SYRINGE) ×3 IMPLANT
TOWEL GREEN STERILE FF (TOWEL DISPOSABLE) ×6 IMPLANT
UNDERPAD 30X36 HEAVY ABSORB (UNDERPADS AND DIAPERS) ×3 IMPLANT

## 2019-05-22 NOTE — Transfer of Care (Signed)
Immediate Anesthesia Transfer of Care Note  Patient: PSALM SCHAPPELL  Procedure(s) Performed: RELEASE TRIGGER FINGER/A-1 PULLEY RIGHT RING FINGER (Right Hand)  Patient Location: PACU  Anesthesia Type:MAC and Bier block  Level of Consciousness: drowsy and patient cooperative  Airway & Oxygen Therapy: Patient Spontanous Breathing and Patient connected to face mask oxygen  Post-op Assessment: Report given to RN and Post -op Vital signs reviewed and stable  Post vital signs: Reviewed and stable  Last Vitals:  Vitals Value Taken Time  BP 122/61 05/22/19 0906  Temp    Pulse 50 05/22/19 0908  Resp 11 05/22/19 0908  SpO2 99 % 05/22/19 0908  Vitals shown include unvalidated device data.  Last Pain:  Vitals:   05/22/19 0734  TempSrc: Oral  PainSc: 0-No pain         Complications: No apparent anesthesia complications

## 2019-05-22 NOTE — Anesthesia Procedure Notes (Signed)
Anesthesia Regional Block: Bier block (IV Regional)   Pre-Anesthetic Checklist: ,, timeout performed, Correct Patient, Correct Site, Correct Laterality, Correct Procedure, Correct Position, site marked, Risks and benefits discussed,  Surgical consent,  Pre-op evaluation,  At surgeon's request and post-op pain management  Laterality: Right  Prep: chloraprep       Needles:  Injection technique: Single-shot      Needle Gauge: 22     Additional Needles:   Procedures:,,,,, intact distal pulses, Esmarch exsanguination, single tourniquet utilized, #20gu IV placed  Narrative:  Start time: 05/22/2019 8:42 AM End time: 05/22/2019 8:44 AM  Performed by: Personally  CRNA: Genelle Bal, CRNA

## 2019-05-22 NOTE — Op Note (Signed)
NAME: Albert Alvarez MEDICAL RECORD NO: [id number] DATE OF BIRTH: 12/31/1950 FACILITY[facility] LOCATION: [facility] PHYSICIAN: Wynonia Sours, MD   OPERATIVE REPORT   DATE OF PROCEDURE: 05/22/19    PREOPERATIVE DIAGNOSIS:   Stenosing tenosynovitis right ring finger   POSTOPERATIVE DIAGNOSIS:   Same   PROCEDURE:   Release A1 pulley right ring finger   SURGEON: Daryll Brod, M.D.   ASSISTANT: none   ANESTHESIA:  Bier block with sedation and Local   INTRAVENOUS FLUIDS:  Per anesthesia flow sheet.   ESTIMATED BLOOD LOSS:  Minimal.   COMPLICATIONS:  None.   SPECIMENS:  none   TOURNIQUET TIME:    Total Tourniquet Time Documented: Forearm (Right) - 15 minutes Total: Forearm (Right) - 15 minutes    DISPOSITION:  Stable to PACU.   INDICATIONS:  *Patient is a 68 year old male with a history of triggering of his right ring finger.  This not responded to conservative treatment.  He has elected undergo surgical release of the A1 pulley.  Preperi-and postoperative course been discussed along with risk complications.  He is aware that there is no guarantee to the surgery the possibility of infection recurrence injury to arteries nerves tendons incomplete relief of symptoms and dystrophy.  In preoperative area the patient seen the extremity marked by both patient and surgeon antibiotic  OPERATIVE COURSE: Patient is brought to the operating room placed in a supine position with the right arm free.  Prepped and draped after a forearm IV regional anesthetic was carried out without difficulty under the direction of the anesthesia department.  Prep was done with ChloraPrep 3-minute dry time was allowed and timeout taken confirming patient procedure.  Oblique incision was then made over the A1 pulley of the right ring finger carried down through subcutaneous tissue.  Bleeders were electrocauterized with bipolar as necessary.  The A1 pulley was identified.  This was released on its  radial aspect.  A small incision was made centrally and A2.  The 2 tendons were then separated with blunt retractors breaking any adhesions between the 2 tendons.  The finger was placed placed through full flexion extension.  No further triggering was noted.  The wound was copious irrigated with saline.  The skin was closed interrupted 4-0 nylon sutures.  Sterile compressive dressing with the fingers 3 was applied to it.  On deflation of the tourniquet all fingers immediately pink.  He was taken to the recovery room for observation in satisfactory condition.  He will be discharged home to [facility] in 1week on Tylenol ibuprofen for pain with Ultram for breakthrough.   Daryll Brod, MD Electronically signed, 05/22/19

## 2019-05-22 NOTE — Brief Op Note (Signed)
05/22/2019  9:08 AM  PATIENT:  Jolyn Nap  68 y.o. male  PRE-OPERATIVE DIAGNOSIS:  RIGHT RING TRIGGER FINGER  POST-OPERATIVE DIAGNOSIS:  RIGHT RING TRIGGER FINGER  PROCEDURE:  Procedure(s) with comments: RELEASE TRIGGER FINGER/A-1 PULLEY RIGHT RING FINGER (Right) - IV REGIONAL FOREARM BLOCK  SURGEON:  Surgeon(s) and Role:    * Daryll Brod, MD - Primary  PHYSICIAN ASSISTANT:   ASSISTANTS: none   ANESTHESIA:   local, regional and IV sedation  EBL:  62ml   BLOOD ADMINISTERED:none  DRAINS: none   LOCAL MEDICATIONS USED:  BUPIVICAINE   SPECIMEN:  No Specimen  DISPOSITION OF SPECIMEN:  N/A  COUNTS:  YES  TOURNIQUET:   Total Tourniquet Time Documented: Forearm (Right) - 15 minutes Total: Forearm (Right) - 15 minutes   DICTATION: .Dragon Dictation  PLAN OF CARE: Discharge to home after PACU  PATIENT DISPOSITION:  PACU - hemodynamically stable.

## 2019-05-22 NOTE — H&P (Signed)
Albert Alvarez is an 68 y.o. male.   Chief Complaint: catching right ring finger HPI: Albert Alvarez is a 68 year old right-hand-dominant male referred by Dr. Haynes Kerns for consultation regarding pain catching in his right ring finger left middle finger. The right ring finger is been going on for 18 months left middle finger for 15 months. He has no history of injury. He states in the morning it seems to be worse with gripping. He has a VAS score of these catch demonstrating about with a painful sharp pain from 7-8 over 10. He states gripping pinching will frequently anger aggravate this for him. He is not complaining of any numbness or tingling. He has no history of diabetes thyroid problems arthritis or gout. Family history is positive diabetes arthritis negative for thyroid problems and gout. He has been tested for diabetes.  These have each been injected on 2 occasions. He states that he right ring finger is problematic the left ring finger less so. Both are catching. The right remains slightly painful has no pain on the left side  Past Medical History:  Diagnosis Date  . Detached retina   . DJD (degenerative joint disease)    spine  . Hypertrophic cardiomyopathy ([place])   . Kidney stones   . Macular degeneration     Past Surgical History:  Procedure Laterality Date  . BACK SURGERY  1988  . KNEE SURGERY  1998  . US ECHOCARDIOGRAPHY  06-12-2049   EF 61%    Family History  Problem Relation Age of Onset  . Arrhythmia Father    Social History:  reports that he has never smoked. He has never used smokeless tobacco. He reports that he does not drink alcohol or use drugs.  Allergies:  Allergies  Allergen Reactions  . Codeine     No medications prior to admission.    No results found for this or any previous visit (from the past 48 hour(s)).  No results found.   Pertinent items are noted in HPI.  Height 5\' 10"  (1.778 m), weight 86.2 kg.  General appearance: alert, cooperative and  appears stated age Head: Normocephalic, without obvious abnormality Neck: no JVD Resp: clear to auscultation bilaterally Cardio: regular rate and rhythm, S1, S2 normal, no murmur, click, rub or gallop GI: soft, non-tender; bowel sounds normal; no masses,  no organomegaly Extremities: catching right ring finger Pulses: 2+ and symmetric Skin: Skin color, texture, turgor normal. No rashes or lesions Neurologic: Grossly normal Incision/Wound: na  Assessment/Plan Assessment:  1. Trigger middle finger of left hand  2. Trigger ring finger of right hand    Plan: We have discussed surgical release of one or both. He would like to just proceed to have the right side done at the present time. Preperi-and postoperative course of discussed along with risks and complications. He is aware there is no guarantee to the surgery the possibility of infection recurrence injury to arteries nerves tendons complete relief symptoms dystrophy. Scheduled for release A1 pulley right ring finger as an outpatient under regional anesthesia.    Daryll Brod 05/22/2019, 5:29 AM

## 2019-05-22 NOTE — Discharge Instructions (Addendum)

## 2019-05-22 NOTE — Anesthesia Preprocedure Evaluation (Addendum)
Anesthesia Evaluation  Patient identified by MRN, date of birth, ID band Patient awake    Reviewed: Allergy & Precautions, H&P , NPO status , Patient's Chart, lab work & pertinent test results, reviewed documented beta blocker date and time   Airway Mallampati: I  TM Distance: >3 FB Neck ROM: full    Dental no notable dental hx. (+) Teeth Intact, Dental Advisory Given   Pulmonary neg pulmonary ROS,    Pulmonary exam normal breath sounds clear to auscultation       Cardiovascular Exercise Tolerance: Good hypertension, Pt. on medications and Pt. on home beta blockers negative cardio ROS   Rhythm:regular Rate:Normal  ECHO 40' Study Conclusions  - Left ventricle: The cavity size was normal. Wall thickness was   increased in a pattern of moderate LVH. There was severe   assymetric focal basal hypertrophy of the septum. No signficant   LVOT obstruction noted at rest. Systolic function was normal. The   estimated ejection fraction was in the range of 55% to 60%. Wall   motion was normal; there were no regional wall motion   abnormalities. Doppler parameters are consistent with abnormal   left ventricular relaxation (grade 1 diastolic dysfunction). The   E/e&' ratio is between 8-15, suggesting indeterminate LV filling   pressure.   Neuro/Psych negative neurological ROS  negative psych ROS   GI/Hepatic negative GI ROS, Neg liver ROS,   Endo/Other  negative endocrine ROS  Renal/GU Renal diseasenegative Renal ROS  negative genitourinary   Musculoskeletal negative musculoskeletal ROS (+) Arthritis , Osteoarthritis,    Abdominal   Peds negative pediatric ROS (+)  Hematology negative hematology ROS (+)   Anesthesia Other Findings   Reproductive/Obstetrics negative OB ROS                            Anesthesia Physical Anesthesia Plan  ASA: II  Anesthesia Plan: MAC and Bier Block and Bier  Block-LIDOCAINE ONLY   Post-op Pain Management:    Induction: Intravenous  PONV Risk Score and Plan: 1  Airway Management Planned: Mask, Natural Airway and Nasal Cannula  Additional Equipment:   Intra-op Plan:   Post-operative Plan:   Informed Consent: I have reviewed the patients History and Physical, chart, labs and discussed the procedure including the risks, benefits and alternatives for the proposed anesthesia with the patient or authorized representative who has indicated his/her understanding and acceptance.     Dental Advisory Given  Plan Discussed with: CRNA, Anesthesiologist and Surgeon  Anesthesia Plan Comments:         Anesthesia Quick Evaluation

## 2019-05-22 NOTE — Anesthesia Postprocedure Evaluation (Signed)
Anesthesia Post Note  Patient: Albert Alvarez  Procedure(s) Performed: RELEASE TRIGGER FINGER/A-1 PULLEY RIGHT RING FINGER (Right Hand)     Anesthesia Post Evaluation  Last Vitals:  Vitals:   05/22/19 0915 05/22/19 0930  BP: 129/70 136/74  Pulse: (!) 54 (!) 50  Resp: 14 14  Temp:    SpO2: 100% 99%    Last Pain:  Vitals:   05/22/19 0930  TempSrc:   PainSc: 0-No pain                 Dellene Mcgroarty

## 2019-05-23 ENCOUNTER — Encounter: Payer: Self-pay | Admitting: *Deleted

## 2019-05-29 DIAGNOSIS — Z1212 Encounter for screening for malignant neoplasm of rectum: Secondary | ICD-10-CM | POA: Diagnosis not present

## 2019-06-11 DIAGNOSIS — M5125 Other intervertebral disc displacement, thoracolumbar region: Secondary | ICD-10-CM | POA: Diagnosis not present

## 2019-06-11 DIAGNOSIS — M50323 Other cervical disc degeneration at C6-C7 level: Secondary | ICD-10-CM | POA: Diagnosis not present

## 2019-06-11 DIAGNOSIS — M9902 Segmental and somatic dysfunction of thoracic region: Secondary | ICD-10-CM | POA: Diagnosis not present

## 2019-06-11 DIAGNOSIS — M9901 Segmental and somatic dysfunction of cervical region: Secondary | ICD-10-CM | POA: Diagnosis not present

## 2019-06-12 DIAGNOSIS — M9901 Segmental and somatic dysfunction of cervical region: Secondary | ICD-10-CM | POA: Diagnosis not present

## 2019-06-12 DIAGNOSIS — M9902 Segmental and somatic dysfunction of thoracic region: Secondary | ICD-10-CM | POA: Diagnosis not present

## 2019-06-12 DIAGNOSIS — M5125 Other intervertebral disc displacement, thoracolumbar region: Secondary | ICD-10-CM | POA: Diagnosis not present

## 2019-06-12 DIAGNOSIS — M50323 Other cervical disc degeneration at C6-C7 level: Secondary | ICD-10-CM | POA: Diagnosis not present

## 2019-06-13 ENCOUNTER — Other Ambulatory Visit: Payer: Self-pay | Admitting: Cardiovascular Disease

## 2019-06-14 DIAGNOSIS — M9901 Segmental and somatic dysfunction of cervical region: Secondary | ICD-10-CM | POA: Diagnosis not present

## 2019-06-14 DIAGNOSIS — M50323 Other cervical disc degeneration at C6-C7 level: Secondary | ICD-10-CM | POA: Diagnosis not present

## 2019-06-14 DIAGNOSIS — M5125 Other intervertebral disc displacement, thoracolumbar region: Secondary | ICD-10-CM | POA: Diagnosis not present

## 2019-06-14 DIAGNOSIS — M9902 Segmental and somatic dysfunction of thoracic region: Secondary | ICD-10-CM | POA: Diagnosis not present

## 2019-06-25 DIAGNOSIS — M9902 Segmental and somatic dysfunction of thoracic region: Secondary | ICD-10-CM | POA: Diagnosis not present

## 2019-06-25 DIAGNOSIS — M50323 Other cervical disc degeneration at C6-C7 level: Secondary | ICD-10-CM | POA: Diagnosis not present

## 2019-06-25 DIAGNOSIS — M5125 Other intervertebral disc displacement, thoracolumbar region: Secondary | ICD-10-CM | POA: Diagnosis not present

## 2019-06-25 DIAGNOSIS — M9901 Segmental and somatic dysfunction of cervical region: Secondary | ICD-10-CM | POA: Diagnosis not present

## 2019-06-26 DIAGNOSIS — M9902 Segmental and somatic dysfunction of thoracic region: Secondary | ICD-10-CM | POA: Diagnosis not present

## 2019-06-26 DIAGNOSIS — M50323 Other cervical disc degeneration at C6-C7 level: Secondary | ICD-10-CM | POA: Diagnosis not present

## 2019-06-26 DIAGNOSIS — M9901 Segmental and somatic dysfunction of cervical region: Secondary | ICD-10-CM | POA: Diagnosis not present

## 2019-06-26 DIAGNOSIS — M5125 Other intervertebral disc displacement, thoracolumbar region: Secondary | ICD-10-CM | POA: Diagnosis not present

## 2019-06-28 DIAGNOSIS — M5125 Other intervertebral disc displacement, thoracolumbar region: Secondary | ICD-10-CM | POA: Diagnosis not present

## 2019-06-28 DIAGNOSIS — M9901 Segmental and somatic dysfunction of cervical region: Secondary | ICD-10-CM | POA: Diagnosis not present

## 2019-06-28 DIAGNOSIS — M9902 Segmental and somatic dysfunction of thoracic region: Secondary | ICD-10-CM | POA: Diagnosis not present

## 2019-06-28 DIAGNOSIS — M50323 Other cervical disc degeneration at C6-C7 level: Secondary | ICD-10-CM | POA: Diagnosis not present

## 2019-07-03 DIAGNOSIS — M5125 Other intervertebral disc displacement, thoracolumbar region: Secondary | ICD-10-CM | POA: Diagnosis not present

## 2019-07-03 DIAGNOSIS — M9901 Segmental and somatic dysfunction of cervical region: Secondary | ICD-10-CM | POA: Diagnosis not present

## 2019-07-03 DIAGNOSIS — M50323 Other cervical disc degeneration at C6-C7 level: Secondary | ICD-10-CM | POA: Diagnosis not present

## 2019-07-03 DIAGNOSIS — M9902 Segmental and somatic dysfunction of thoracic region: Secondary | ICD-10-CM | POA: Diagnosis not present

## 2019-07-05 DIAGNOSIS — M50323 Other cervical disc degeneration at C6-C7 level: Secondary | ICD-10-CM | POA: Diagnosis not present

## 2019-07-05 DIAGNOSIS — M5125 Other intervertebral disc displacement, thoracolumbar region: Secondary | ICD-10-CM | POA: Diagnosis not present

## 2019-07-05 DIAGNOSIS — M9901 Segmental and somatic dysfunction of cervical region: Secondary | ICD-10-CM | POA: Diagnosis not present

## 2019-07-05 DIAGNOSIS — M9902 Segmental and somatic dysfunction of thoracic region: Secondary | ICD-10-CM | POA: Diagnosis not present

## 2019-07-09 ENCOUNTER — Ambulatory Visit: Payer: Medicare Other

## 2019-07-12 DIAGNOSIS — M50323 Other cervical disc degeneration at C6-C7 level: Secondary | ICD-10-CM | POA: Diagnosis not present

## 2019-07-12 DIAGNOSIS — M9901 Segmental and somatic dysfunction of cervical region: Secondary | ICD-10-CM | POA: Diagnosis not present

## 2019-07-12 DIAGNOSIS — M5125 Other intervertebral disc displacement, thoracolumbar region: Secondary | ICD-10-CM | POA: Diagnosis not present

## 2019-07-12 DIAGNOSIS — M9902 Segmental and somatic dysfunction of thoracic region: Secondary | ICD-10-CM | POA: Diagnosis not present

## 2019-07-14 ENCOUNTER — Other Ambulatory Visit: Payer: Self-pay | Admitting: Cardiovascular Disease

## 2019-07-14 IMAGING — MR MR CERVICAL SPINE W/O CM
5 series · 48 of 48 positions shown · non-contrast
Comparison: None available.

CLINICAL DATA: Initial evaluation for bilateral neck, arm, and hand
pain associated with numbness.

EXAM:
MRI CERVICAL SPINE WITHOUT CONTRAST
TECHNIQUE: Multiplanar, multisequence MR imaging of the cervical spine was
performed. No intravenous contrast was administered.

[Series 3: T2 · sagittal · 3.0mm · 0.82mm/px · 6 of 12 slices shown (1 of 2)]
[im 1/12]
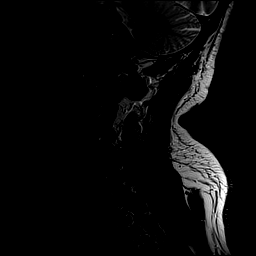
[im 3/12]
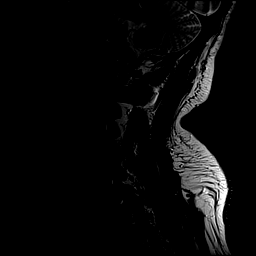
[im 5/12]
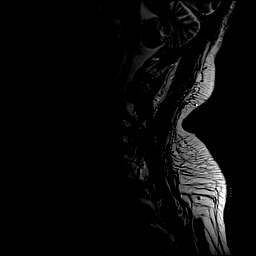
[im 7/12]
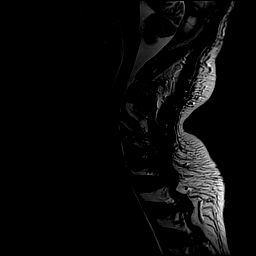
[im 9/12]
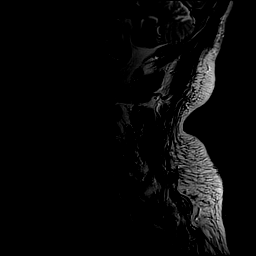
[im 12/12]
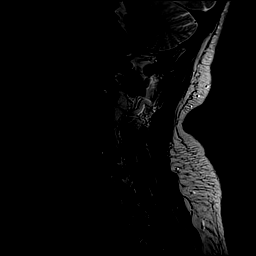

[Series 4: T1 · sagittal · 3.0mm · 0.82mm/px · 7 of 12 slices shown]
[im 1/12]
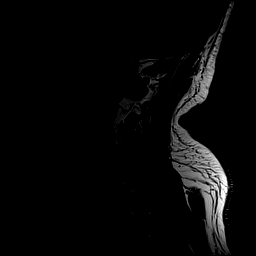
[im 2/12]
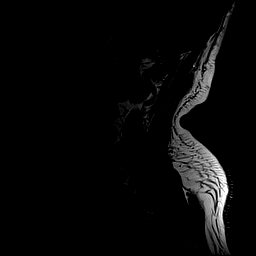
[im 4/12]
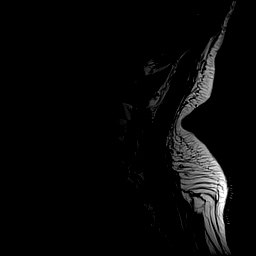
[im 6/12]
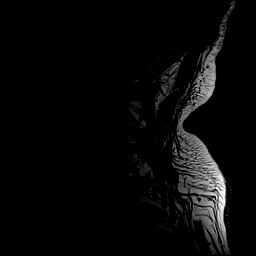
[im 8/12]
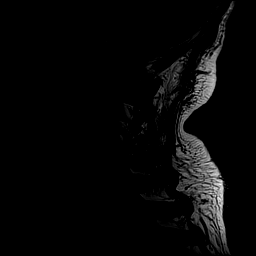
[im 10/12]
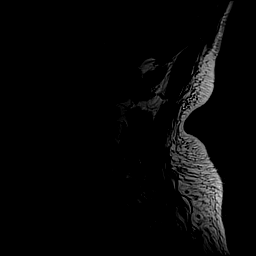
[im 12/12]
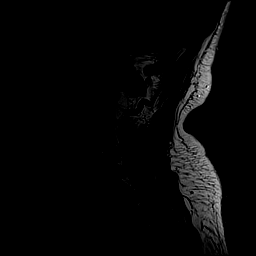

[Series 5: STIR · sagittal · 3.0mm · 0.82mm/px · 7 of 12 slices shown]
[im 1/12]
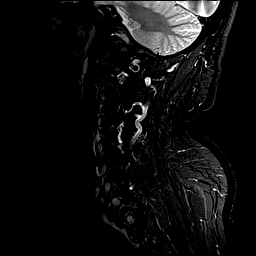
[im 2/12]
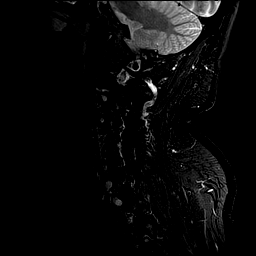
[im 4/12]
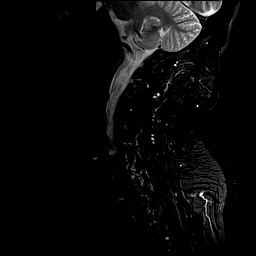
[im 6/12]
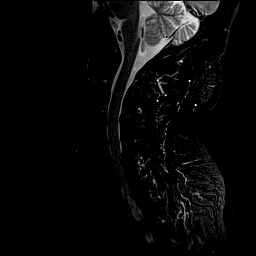
[im 8/12]
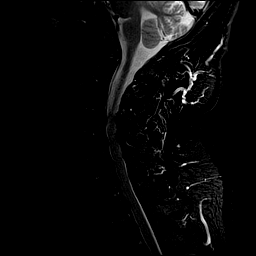
[im 10/12]
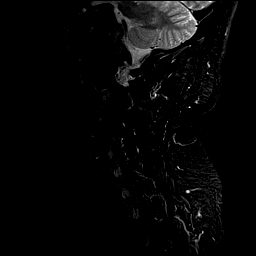
[im 12/12]
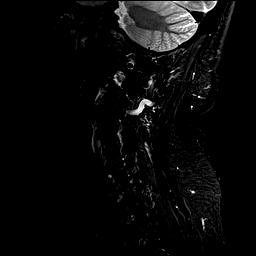

[Series 6: GRE · axial · 3.5mm · 0.74mm/px · z∈[-97,+10]mm · 14 of 26 slices shown]
[im 1/26]
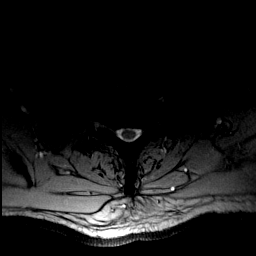
[im 2/26]
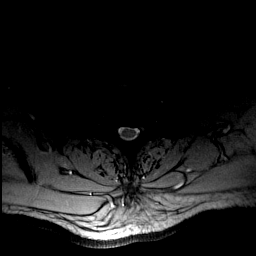
[im 4/26]
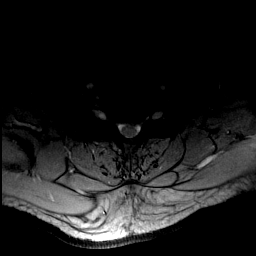
[im 6/26]
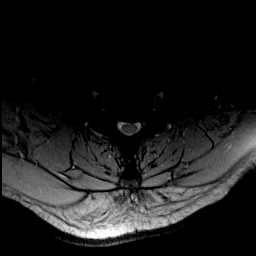
[im 8/26]
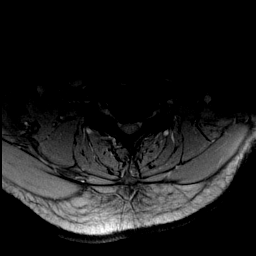
[im 10/26]
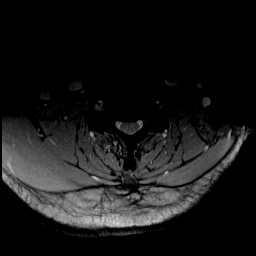
[im 12/26]
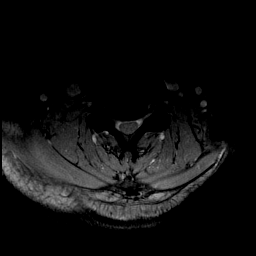
[im 14/26]
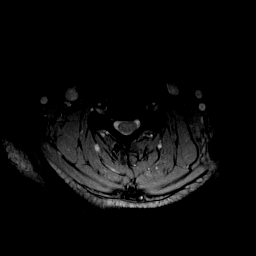
[im 16/26]
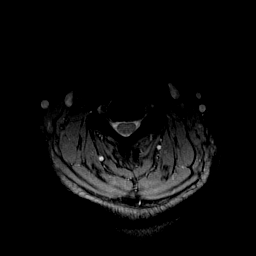
[im 18/26]
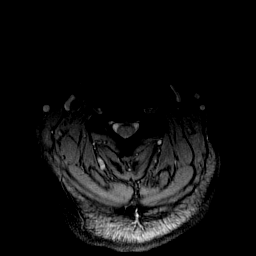
[im 20/26]
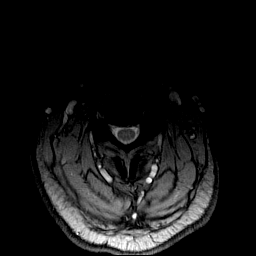
[im 22/26]
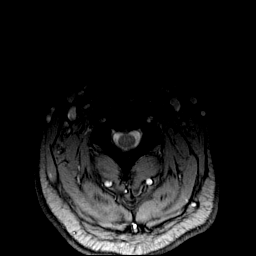
[im 24/26]
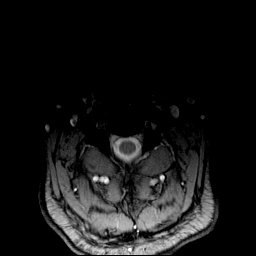
[im 26/26]
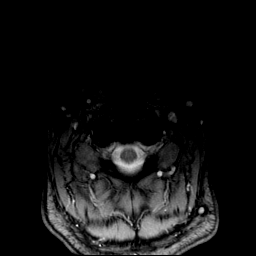

[Series 7: T2 · axial · 3.5mm · 0.74mm/px · z∈[-97,+10]mm · 14 of 26 slices shown (2 of 2)]
[im 1/26]
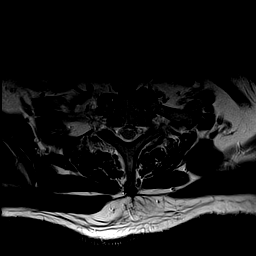
[im 2/26]
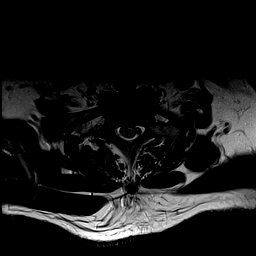
[im 4/26]
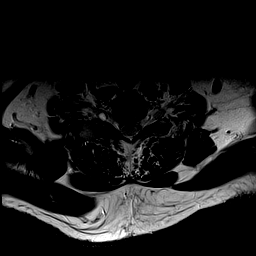
[im 6/26]
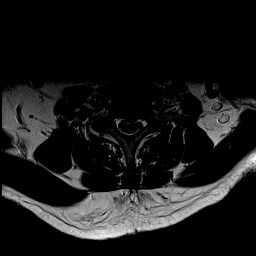
[im 8/26]
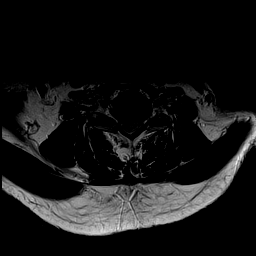
[im 10/26]
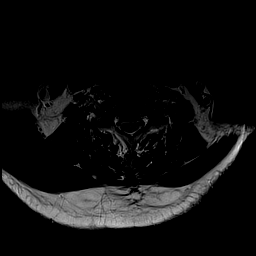
[im 12/26]
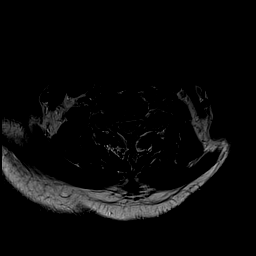
[im 14/26]
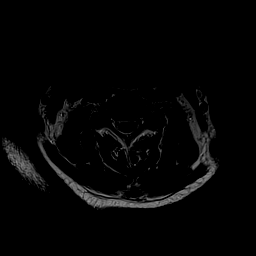
[im 16/26]
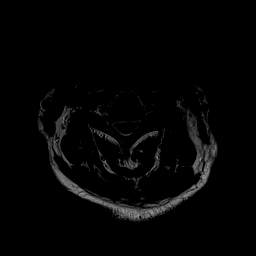
[im 18/26]
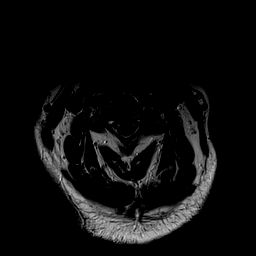
[im 20/26]
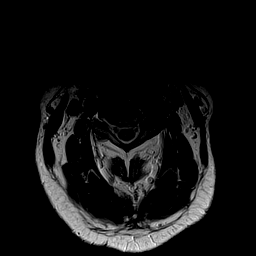
[im 22/26]
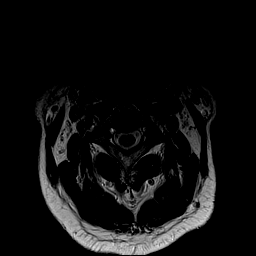
[im 24/26]
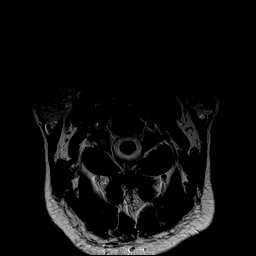
[im 26/26]
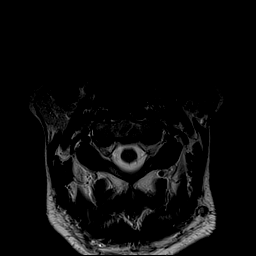

[48 of 48 positions shown; findings below may reference images not displayed]

FINDINGS: Alignment: Vertebral bodies normally aligned with preservation of
the normal cervical lordosis. No listhesis.

Vertebrae: Vertebral body heights maintained without evidence for
acute or chronic fracture. Bone marrow signal intensity within
normal limits. No discrete or worrisome osseous lesions. No abnormal
marrow edema.

Cord: Signal intensity within the cervical spinal cord is normal.

Posterior Fossa, vertebral arteries, paraspinal tissues: Visualized
brain and posterior fossa within normal limits. Craniocervical
junction normal. Paraspinous and prevertebral soft tissues within
normal limits. Normal intravascular flow voids present within the
vertebral arteries bilaterally.

Disc levels:

.

Underlying mild diffuse congenital spinal stenosis noted

C2-C3: Shallow central disc protrusion indents the ventral thecal
sac. Minimal uncovertebral spurring. No significant canal or
foraminal stenosis.

C3-C4: Broad posterior disc bulge flattens and partially faces the
ventral thecal sac. Left greater than right uncovertebral spurring.
Mild facet hypertrophy, also greater on the left. Resultant mild
spinal stenosis. Moderate bilateral C4 foraminal narrowing, left
greater than right.

C4-C5: Diffuse disc bulge with bilateral uncovertebral hypertrophy.
Posterior disc osteophyte largely effaces the ventral thecal sac
resultant mild to moderate spinal stenosis. Moderate bilateral C5
foraminal narrowing, left worse than right.

C5-C6: Right eccentric disc osteophyte complex with intervertebral
disc space narrowing with right greater than left uncovertebral
spurring. Flattening of the right greater than left ventral thecal
sac with flattening of the right hemi cord. Moderate spinal
stenosis. Severe right with moderate left C6 foraminal narrowing.

C6-C7: Diffuse degenerative disc osteophyte with intervertebral disc
space narrowing, slightly asymmetric to the right. Flattening and
partial effacement of the ventral thecal sac with resultant moderate
spinal stenosis. Mild cord flattening. Severe right with moderate
left C7 foraminal stenosis.

C7-T1: Left paracentral disc protrusion indents the left ventral
thecal sac, contacting and flattening the left ventral cord (series
6, image 24). Mild spinal stenosis. Foramina remain patent.

Visualized upper thoracic spine demonstrates mild disc bulging at
T1-2 and T2-3 without significant stenosis.
IMPRESSION: 1. Degenerative disc osteophyte at C5-6 and C6-7 with resultant
moderate canal with severe right and moderate left C6 and C7
foraminal stenosis.
2. Left paracentral disc protrusion at C7-T1, contacting and
flattening the left hemi cord. Left C8 nerve root could potentially
be affected.
3. Degenerative disc bulging with uncovertebral hypertrophy at C3-4
and C4-5 with resultant mild canal with moderate bilateral C4 and C5
foraminal stenosis.

## 2019-07-15 ENCOUNTER — Ambulatory Visit: Payer: Medicare Other | Attending: Internal Medicine

## 2019-07-15 DIAGNOSIS — Z23 Encounter for immunization: Secondary | ICD-10-CM | POA: Insufficient documentation

## 2019-07-15 NOTE — Progress Notes (Signed)
   Covid-19 Vaccination Clinic  Name:  Albert Alvarez    MRN: [id number] DOB: 08/09/1950  07/15/2019  Albert Alvarez was observed post Covid-19 immunization for 15 minutes without incidence. He was provided with Vaccine Information Sheet and instruction to access the V-Safe system.   Albert Alvarez was instructed to call 911 with any severe reactions post vaccine: Marland Kitchen Difficulty breathing  . Swelling of your face and throat  . A fast heartbeat  . A bad rash all over your body  . Dizziness and weakness    Immunizations Administered    Name Date Dose VIS Date Route   Pfizer COVID-19 Vaccine 07/15/2019 10:22 AM 0.3 mL 05/18/2019 Intramuscular   Manufacturer: [place]   Lot: [id number]   [place]: [id number]

## 2019-07-30 ENCOUNTER — Ambulatory Visit: Payer: Medicare Other

## 2019-08-08 ENCOUNTER — Ambulatory Visit: Payer: Medicare Other | Attending: Internal Medicine

## 2019-08-08 DIAGNOSIS — Z23 Encounter for immunization: Secondary | ICD-10-CM | POA: Insufficient documentation

## 2019-08-08 NOTE — Progress Notes (Signed)
   Covid-19 Vaccination Clinic  Name:  Albert Alvarez    MRN: [id number] DOB: [birth year]  08/08/2019  Mr. Albert Alvarez was observed post Covid-19 immunization for 15 minutes without incident. He was provided with Vaccine Information Sheet and instruction to access the V-Safe system.   Mr. Albert Alvarez was instructed to call 911 with any severe reactions post vaccine: Marland Kitchen Difficulty breathing  . Swelling of face and throat  . A fast heartbeat  . A bad rash all over body  . Dizziness and weakness   Immunizations Administered    Name Date Dose VIS Date Route   Pfizer COVID-19 Vaccine 08/08/2019 10:05 AM 0.3 mL 05/18/2019 Intramuscular   Manufacturer: [place]   Lot: [id number]   [place]: [id number]

## 2019-08-10 ENCOUNTER — Ambulatory Visit: Payer: Medicare Other | Admitting: Cardiovascular Disease

## 2019-08-13 ENCOUNTER — Other Ambulatory Visit: Payer: Self-pay | Admitting: Cardiovascular Disease

## 2019-08-21 NOTE — Progress Notes (Deleted)
{Choose 1 Note Type (Telehealth Visit or Telephone Visit):[phone]}  Evaluation Performed:  Follow-up visit  This visit type was conducted due to national recommendations for restrictions regarding the COVID-19 Pandemic (e.g. social distancing).  This format is felt to be most appropriate for this patient at this time.  All issues noted in this document were discussed and addressed.  No physical exam was performed (except for noted visual exam findings with Video Visits).  Please refer to the patient's chart (MyChart message for video visits and phone note for telephone visits) for the patient's consent to telehealth for [facility]  Date:  08/21/2019   ID:  Albert Alvarez, DOB [birth year], MRN [id number]  Patient Location: *** [facility] [address]   Provider location:   [facility] [address], [place] [address]  PCP:  [name], MD  Cardiologist:  No primary care provider on file. *** Electrophysiologist:  None   Chief Complaint:  ***  History of Present Illness:    Albert Alvarez is a 69 y.o. male who presents via audio/video conferencing for a telehealth visit today.  Patient verified DOB and address.  The patient {does/does not:200015} symptoms concerning for COVID-19 infection (fever, chills, cough, or new SHORTNESS OF BREATH).    Prior CV studies:   The following studies were reviewed today:  ***  Past Medical History:  Diagnosis Date  . Detached retina   . DJD (degenerative joint disease)    spine  . Hypertrophic cardiomyopathy ([place])   . Kidney stones   . Macular degeneration    Past Surgical History:  Procedure Laterality Date  . BACK SURGERY  1988  . KNEE SURGERY  1998  . TRIGGER FINGER RELEASE Right 05/22/2019   Procedure: RELEASE TRIGGER FINGER/A-1 PULLEY RIGHT RING FINGER;  Surgeon: Daryll Brod, MD;  Location: [facility];  Service: Orthopedics;  Laterality: Right;  IV REGIONAL FOREARM  BLOCK  . US ECHOCARDIOGRAPHY  [birth year]   EF 61%     No outpatient medications have been marked as taking for the 08/22/19 encounter (Appointment) with Deberah Pelton, NP.     Allergies:   Codeine   Social History   Tobacco Use  . Smoking status: Never Smoker  . Smokeless tobacco: Never Used  Substance Use Topics  . Alcohol use: No  . Drug use: Never     Family Hx: The patient's family history includes Arrhythmia in his father.  ROS:   Please see the history of present illness.     All other systems reviewed and are negative.   Labs/Other Tests and Data Reviewed:    Recent Labs: No results found for requested labs within last 8760 hours.   Recent Lipid Panel No results found for: CHOL, TRIG, HDL, CHOLHDL, LDLCALC, LDLDIRECT  Wt Readings from Last 3 Encounters:  05/22/19 195 lb 8.8 oz (88.7 kg)  07/07/18 194 lb 3.2 oz (88.1 kg)  04/05/17 199 lb 3.2 oz (90.4 kg)     Exam:    Vital Signs:  There were no vitals taken for this visit.   Well nourished, well developed male in no  acute distress.   ASSESSMENT & PLAN:    1.    COVID-19 Education: The signs and symptoms of COVID-19 were discussed with the patient and how to seek care for testing (follow up with PCP or arrange E-visit).  The importance of social distancing was discussed today.  Patient Risk:  After full review of this patients clinical status, I feel that they are at least moderate risk at this time.  Time:   Today, I have spent *** minutes with the patient with telehealth technology discussing ***.     Medication Adjustments/Labs and Tests Ordered: Current medicines are reviewed at length with the patient today.  Concerns regarding medicines are outlined above.   Tests Ordered: No orders of the defined types were placed in this encounter.  Medication Changes: No orders of the defined types were placed in this encounter.   Disposition:  {follow up:15908}  Signed,  Jossie Ng. [facility] [place] [address] Office [phone] Fax [phone]

## 2019-08-22 ENCOUNTER — Telehealth: Payer: Medicare Other | Admitting: General Practice

## 2019-09-12 ENCOUNTER — Other Ambulatory Visit: Payer: Self-pay | Admitting: Cardiovascular Disease

## 2019-09-12 NOTE — Telephone Encounter (Signed)
Rx request sent to pharmacy.  

## 2019-09-14 ENCOUNTER — Other Ambulatory Visit: Payer: Self-pay

## 2019-09-14 ENCOUNTER — Ambulatory Visit (INDEPENDENT_AMBULATORY_CARE_PROVIDER_SITE_OTHER): Payer: Medicare Other | Admitting: Cardiovascular Disease

## 2019-09-14 ENCOUNTER — Other Ambulatory Visit: Payer: Self-pay | Admitting: *Deleted

## 2019-09-14 ENCOUNTER — Encounter: Payer: Self-pay | Admitting: Cardiovascular Disease

## 2019-09-14 VITALS — BP 120/80 | HR 63 | Ht 70.0 in | Wt 199.2 lb

## 2019-09-14 DIAGNOSIS — I422 Other hypertrophic cardiomyopathy: Secondary | ICD-10-CM | POA: Diagnosis not present

## 2019-09-14 DIAGNOSIS — E782 Mixed hyperlipidemia: Secondary | ICD-10-CM

## 2019-09-14 MED ORDER — ATENOLOL 25 MG PO TABS: 25.0000 mg | ORAL_TABLET | Freq: Every day | ORAL | 3 refills | Status: DC

## 2019-09-14 NOTE — Progress Notes (Signed)
09/14/2019 NILE DRABEK   1950-07-30  [id number]  Primary Physician [name], MD Primary Cardiologist: Lorretta Harp MD Lupe Carney, [place]  HPI:  Albert Alvarez is a 69 y.o.   married Caucasian male father of 78, grandfather to 6 grandchildren was formally a patient of Dr. Romeo Apple and Dr. Peter Martinique who transferred his care to myself. I last saw him in the office 07/07/2018.  His cardiovascular risk profile is entirely benign. He's never had a heart attack or stroke and denies chest pain. Does get some mild dyspnea on exertion. He had a 2-D echo performed several years ago that apparently showed asymmetrical septal hypertrophy with preserved ejection fraction. Likewise, a Myoview stress test which was normal and at that time an event monitor which did not show any arrhythmias. Since I saw him in the office7/19/17, he has remained remarkably stable. He is fairly active and walks frequently complaining of some mild dyspnea on exertion . He denies chest pain. 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 He was the [employer] until last year now he works near 2 days a week as a Optometrist.Of note, he has lost 18 pounds since I last saw him which he attributes to being on Weight Watchers and feels clinically improved. Recent blood performed 03/24/2018 revealed total cholesterol 163, LDL 104 and HDL 35.  His Lipitor was changed to rosuvastatin.  He walks 3 to 4 miles a day 6 days a week and is otherwise asymptomatic.    Interestingly, Dr. Joylene Draft, his PCP, ordered a coronary calcium score 04/07/2018 which was 251 with calcium in the LAD territory.  Since I saw him a year ago he continues to do well.  He is otherwise asymptomatic.  His major complaints are some numbness in his feet which is intermittent I suspect his neurologic.  Does complain of some ED and is on Viagra type medications.  He denies chest pain or shortness of breath.  His most recent lipid profile on Crestor  performed 05/15/2019 revealed a total cholesterol 126 with an LDL of 64.   Current Meds  Medication Sig  . atenolol (TENORMIN) 25 MG tablet Take 1 tablet (25 mg total) by mouth daily. Please keep your upcoming appointment for refills.  Marland Kitchen BIOFLAVONOID PRODUCTS PO Take 1,000 mg by mouth daily.  . Cholecalciferol (VITAMIN D PO) Take by mouth daily.    . Multiple Vitamin (MULTIVITAMIN) tablet Take 1 tablet by mouth daily.    . rosuvastatin (CRESTOR) 10 MG tablet   . traMADol (ULTRAM) 50 MG tablet Take 1 tablet (50 mg total) by mouth every 6 (six) hours as needed.     Allergies  Allergen Reactions  . Codeine     Social History   Socioeconomic History  . Marital status: Married    Spouse name: Not on file  . Number of children: 3  . Years of education: Not on file  . Highest education level: Not on file  Occupational History  . 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 Occupation: accountant    Comment: [place]  Tobacco Use  . Smoking status: Never Smoker  . Smokeless tobacco: Never Used  Substance and Sexual Activity  . Alcohol use: No  . Drug use: Never  . Sexual activity: Yes  Other Topics Concern  . Not on file  Social History Narrative  . Not on file   Social Determinants of Health  Financial Resource Strain:   . Difficulty of Paying Living Expenses:   Food Insecurity:   . Worried About Charity fundraiser in the Last Year:   . Arboriculturist in the Last Year:   Transportation Needs:   . Film/video editor (Medical):   Marland Kitchen Lack of Transportation (Non-Medical):   Physical Activity:   . Days of Exercise per Week:   . Minutes of Exercise per Session:   Stress:   . Feeling of Stress :   Social Connections:   . Frequency of Communication with Friends and Family:   . Frequency of Social Gatherings with Friends and Family:   . Attends Religious Services:   . Active Member of Clubs or Organizations:   . Attends Archivist Meetings:   Marland Kitchen Marital  Status:   Intimate Partner Violence:   . Fear of Current or Ex-Partner:   . Emotionally Abused:   Marland Kitchen Physically Abused:   . Sexually Abused:      Review of Systems: General: negative for chills, fever, night sweats or weight changes.  Cardiovascular: negative for chest pain, dyspnea on exertion, edema, orthopnea, palpitations, paroxysmal nocturnal dyspnea or shortness of breath Dermatological: negative for rash Respiratory: negative for cough or wheezing Urologic: negative for hematuria Abdominal: negative for nausea, vomiting, diarrhea, bright red blood per rectum, melena, or hematemesis Neurologic: negative for visual changes, syncope, or dizziness All other systems reviewed and are otherwise negative except as noted above.    Blood pressure 120/80, pulse 63, height 5\' 10"  (1.778 m), weight 199 lb 3.2 oz (90.4 kg).  General appearance: alert and no distress Neck: no adenopathy, no carotid bruit, no JVD, supple, symmetrical, trachea midline and thyroid not enlarged, symmetric, no tenderness/mass/nodules Lungs: clear to auscultation bilaterally Heart: regular rate and rhythm, S1, S2 normal, no murmur, click, rub or gallop Extremities: extremities normal, atraumatic, no cyanosis or edema Pulses: 2+ and symmetric Skin: Skin color, texture, turgor normal. No rashes or lesions Neurologic: Alert and oriented X 3, normal strength and tone. Normal symmetric reflexes. Normal coordination and gait  EKG sinus rhythm at 63 with borderline LVH voltage. I  Personally reviewed this EKG.  ASSESSMENT AND PLAN:   Hypertrophic cardiomyopathy Last 2D echocardiogram performed 04/15/2017 did show basal septal hypertrophy without resting outflow tract gradient, normal LV function and grade 1 diastolic dysfunction.  He is otherwise asymptomatic.  Hyperlipidemia History of hyperlipidemia on Crestor with an LDL of 64 measured 05/15/2019.  I recommended that he stay on his same dose of  Crestor.      Lorretta Harp MD FACP,FACC,FAHA, [facility] 09/14/2019 8:45 AM

## 2019-09-14 NOTE — Assessment & Plan Note (Signed)
Last 2D echocardiogram performed 04/15/2017 did show basal septal hypertrophy without resting outflow tract gradient, normal LV function and grade 1 diastolic dysfunction.  He is otherwise asymptomatic.

## 2019-09-14 NOTE — Assessment & Plan Note (Signed)
History of hyperlipidemia on Crestor with an LDL of 64 measured 05/15/2019.  I recommended that he stay on his same dose of Crestor.

## 2019-09-14 NOTE — Patient Instructions (Signed)
Medication Instructions:  Your physician recommends that you continue on your current medications as directed. Please refer to the Current Medication list given to you today.  *If you need a refill on your cardiac medications before your next appointment, please call your pharmacy*  Lab Work: NONE  Testing/Procedures: NONE  Follow-Up: At [facility], you and your health needs are our priority.  As part of our continuing mission to provide you with exceptional heart care, we have created designated Provider Care Teams.  These Care Teams include your primary Cardiologist (physician) and Advanced Practice Providers (APPs -  Physician Assistants and Nurse Practitioners) who all work together to provide you with the care you need, when you need it.  We recommend signing up for the patient portal called "MyChart".  Sign up information is provided on this After Visit Summary.  MyChart is used to connect with patients for Virtual Visits (Telemedicine).  Patients are able to view lab/test results, encounter notes, upcoming appointments, etc.  Non-urgent messages can be sent to your provider as well.   To learn more about what you can do with MyChart, go to [email or web address].    Your next appointment:   12 month(s)  The format for your next appointment:   In Person  Provider:   Jonathan Berry, MD     

## 2019-12-25 DIAGNOSIS — H35342 Macular cyst, hole, or pseudohole, left eye: Secondary | ICD-10-CM | POA: Diagnosis not present

## 2019-12-25 DIAGNOSIS — H442E3 Degenerative myopia with other maculopathy, bilateral eye: Secondary | ICD-10-CM | POA: Diagnosis not present

## 2019-12-25 DIAGNOSIS — H35372 Puckering of macula, left eye: Secondary | ICD-10-CM | POA: Diagnosis not present

## 2019-12-25 DIAGNOSIS — H442A2 Degenerative myopia with choroidal neovascularization, left eye: Secondary | ICD-10-CM | POA: Diagnosis not present

## 2020-03-20 DIAGNOSIS — L812 Freckles: Secondary | ICD-10-CM | POA: Diagnosis not present

## 2020-03-20 DIAGNOSIS — D225 Melanocytic nevi of trunk: Secondary | ICD-10-CM | POA: Diagnosis not present

## 2020-03-20 DIAGNOSIS — L820 Inflamed seborrheic keratosis: Secondary | ICD-10-CM | POA: Diagnosis not present

## 2020-03-20 DIAGNOSIS — L821 Other seborrheic keratosis: Secondary | ICD-10-CM | POA: Diagnosis not present

## 2020-03-20 DIAGNOSIS — D1801 Hemangioma of skin and subcutaneous tissue: Secondary | ICD-10-CM | POA: Diagnosis not present

## 2020-06-11 DIAGNOSIS — E785 Hyperlipidemia, unspecified: Secondary | ICD-10-CM | POA: Diagnosis not present

## 2020-06-11 DIAGNOSIS — G2581 Restless legs syndrome: Secondary | ICD-10-CM | POA: Diagnosis not present

## 2020-06-11 DIAGNOSIS — N183 Chronic kidney disease, stage 3 unspecified: Secondary | ICD-10-CM | POA: Diagnosis not present

## 2020-06-11 DIAGNOSIS — D7589 Other specified diseases of blood and blood-forming organs: Secondary | ICD-10-CM | POA: Diagnosis not present

## 2020-06-11 DIAGNOSIS — Z125 Encounter for screening for malignant neoplasm of prostate: Secondary | ICD-10-CM | POA: Diagnosis not present

## 2020-06-18 DIAGNOSIS — N183 Chronic kidney disease, stage 3 unspecified: Secondary | ICD-10-CM | POA: Diagnosis not present

## 2020-06-18 DIAGNOSIS — I422 Other hypertrophic cardiomyopathy: Secondary | ICD-10-CM | POA: Diagnosis not present

## 2020-06-18 DIAGNOSIS — Z20828 Contact with and (suspected) exposure to other viral communicable diseases: Secondary | ICD-10-CM | POA: Diagnosis not present

## 2020-06-18 DIAGNOSIS — M5136 Other intervertebral disc degeneration, lumbar region: Secondary | ICD-10-CM | POA: Diagnosis not present

## 2020-06-18 DIAGNOSIS — Z1212 Encounter for screening for malignant neoplasm of rectum: Secondary | ICD-10-CM | POA: Diagnosis not present

## 2020-06-18 DIAGNOSIS — Z23 Encounter for immunization: Secondary | ICD-10-CM | POA: Diagnosis not present

## 2020-06-18 DIAGNOSIS — I2510 Atherosclerotic heart disease of native coronary artery without angina pectoris: Secondary | ICD-10-CM | POA: Diagnosis not present

## 2020-06-18 DIAGNOSIS — Z1331 Encounter for screening for depression: Secondary | ICD-10-CM | POA: Diagnosis not present

## 2020-06-18 DIAGNOSIS — G629 Polyneuropathy, unspecified: Secondary | ICD-10-CM | POA: Diagnosis not present

## 2020-06-18 DIAGNOSIS — R82998 Other abnormal findings in urine: Secondary | ICD-10-CM | POA: Diagnosis not present

## 2020-06-18 DIAGNOSIS — Z0000 Encounter for general adult medical examination without abnormal findings: Secondary | ICD-10-CM | POA: Diagnosis not present

## 2020-08-20 DIAGNOSIS — R030 Elevated blood-pressure reading, without diagnosis of hypertension: Secondary | ICD-10-CM | POA: Diagnosis not present

## 2020-08-20 DIAGNOSIS — M4696 Unspecified inflammatory spondylopathy, lumbar region: Secondary | ICD-10-CM | POA: Diagnosis not present

## 2020-08-20 DIAGNOSIS — Z6829 Body mass index (BMI) 29.0-29.9, adult: Secondary | ICD-10-CM | POA: Diagnosis not present

## 2020-09-02 DIAGNOSIS — M4807 Spinal stenosis, lumbosacral region: Secondary | ICD-10-CM | POA: Diagnosis not present

## 2020-09-02 DIAGNOSIS — M545 Low back pain, unspecified: Secondary | ICD-10-CM | POA: Diagnosis not present

## 2020-09-05 DIAGNOSIS — M4807 Spinal stenosis, lumbosacral region: Secondary | ICD-10-CM | POA: Diagnosis not present

## 2020-09-17 DIAGNOSIS — M4807 Spinal stenosis, lumbosacral region: Secondary | ICD-10-CM | POA: Diagnosis not present

## 2020-09-17 DIAGNOSIS — M545 Low back pain, unspecified: Secondary | ICD-10-CM | POA: Diagnosis not present

## 2020-09-25 DIAGNOSIS — M545 Low back pain, unspecified: Secondary | ICD-10-CM | POA: Diagnosis not present

## 2020-09-25 DIAGNOSIS — M4807 Spinal stenosis, lumbosacral region: Secondary | ICD-10-CM | POA: Diagnosis not present

## 2020-10-01 DIAGNOSIS — M545 Low back pain, unspecified: Secondary | ICD-10-CM | POA: Diagnosis not present

## 2020-10-01 DIAGNOSIS — M4807 Spinal stenosis, lumbosacral region: Secondary | ICD-10-CM | POA: Diagnosis not present

## 2020-10-03 ENCOUNTER — Other Ambulatory Visit: Payer: Self-pay

## 2020-10-03 ENCOUNTER — Encounter: Payer: Self-pay | Admitting: Cardiovascular Disease

## 2020-10-03 ENCOUNTER — Ambulatory Visit (INDEPENDENT_AMBULATORY_CARE_PROVIDER_SITE_OTHER): Payer: Medicare Other | Admitting: Cardiovascular Disease

## 2020-10-03 VITALS — BP 130/78 | HR 65 | Ht 71.0 in | Wt 205.0 lb

## 2020-10-03 DIAGNOSIS — R931 Abnormal findings on diagnostic imaging of heart and coronary circulation: Secondary | ICD-10-CM

## 2020-10-03 DIAGNOSIS — E782 Mixed hyperlipidemia: Secondary | ICD-10-CM

## 2020-10-03 DIAGNOSIS — I422 Other hypertrophic cardiomyopathy: Secondary | ICD-10-CM | POA: Diagnosis not present

## 2020-10-03 NOTE — Assessment & Plan Note (Signed)
History of hyperlipidemia on statin therapy with lipid profile recently performed 06/11/2020 revealing total cholesterol 110, LDL 49 and HDL 34.

## 2020-10-03 NOTE — Progress Notes (Signed)
10/03/2020 Albert Alvarez   1950/11/04  [id number]  Primary Physician [name], MD Primary Cardiologist: Lorretta Harp MD Lupe Carney, [place]  HPI:  Albert Alvarez is a 70 y.o.  married Caucasian male father of 64, grandfather to 6 grandchildren was formally a patient of Dr. Romeo Apple and Dr. Peter Martinique who transferred his care to myself. I last saw him in the office 09/14/2019.  His cardiovascular risk profile is entirely benign. He's never had a heart attack or stroke and denies chest pain. Does get some mild dyspnea on exertion. He had a 2-D echo performed several years ago that apparently showed asymmetrical septal hypertrophy with preserved ejection fraction. Likewise, a Myoview stress test which was normal and at that time an event monitor which did not show any arrhythmias.   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    He walks 3 to 4 miles a day 6 days a week and is otherwise asymptomatic.   Interestingly, Dr. Joylene Draft, his PCP, ordered a coronary calcium score 04/07/2018 which was 251 with calcium in the LAD territory.  Since I saw him a year ago he continues to do well.    He walks 3 to 4 miles a day.  His major complaints remain restless leg syndrome and lower extremity numbness which sounds neuropathic.  He has seen his neurosurgeon Dr. Ellene Route as well and has been put on gabapentin with some relief.  His most recent lipid profile performed 06/11/2020 revealed total cholesterol 110, LDL 49 HDL 34.   Current Meds  Medication Sig  . atenolol (TENORMIN) 25 MG tablet Take 1 tablet (25 mg total) by mouth daily.  Marland Kitchen BIOFLAVONOID PRODUCTS PO Take 1,000 mg by mouth daily.  . Cholecalciferol (VITAMIN D PO) Take by mouth daily.  Marland Kitchen gabapentin  (NEURONTIN) 300 MG capsule Take 1 capsule by mouth daily.  . Multiple Vitamin (MULTIVITAMIN) tablet Take 1 tablet by mouth daily.  . rosuvastatin (CRESTOR) 10 MG tablet   . [DISCONTINUED] traMADol (ULTRAM) 50 MG tablet Take 1 tablet (50 mg total) by mouth every 6 (six) hours as needed.     Allergies  Allergen Reactions  . Codeine     Social History   Socioeconomic History  . Marital status: Married    Spouse name: Not on file  . Number of children: 3  . Years of education: Not on file  . Highest education level: Not on file  Occupational History  . 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 Occupation: accountant    Comment: [employer]  Tobacco Use  . Smoking status: Never Smoker  . Smokeless tobacco: Never Used  Vaping Use  . Vaping Use: Never used  Substance and Sexual Activity  . Alcohol use: No  . Drug use: Never  . Sexual activity: Yes  Other Topics Concern  . Not on file  Social History Narrative  . Not on file   Social Determinants of Health   Financial Resource Strain: Not on file  Food Insecurity: Not on file  Transportation Needs: Not on file  Physical Activity: Not on file  Stress: Not on file  Social Connections: Not on file  Intimate Partner Violence: Not on  file     Review of Systems: General: negative for chills, fever, night sweats or weight changes.  Cardiovascular: negative for chest pain, dyspnea on exertion, edema, orthopnea, palpitations, paroxysmal nocturnal dyspnea or shortness of breath Dermatological: negative for rash Respiratory: negative for cough or wheezing Urologic: negative for hematuria Abdominal: negative for nausea, vomiting, diarrhea, bright red blood per rectum, melena, or hematemesis Neurologic: negative for visual changes, syncope, or dizziness All other systems reviewed and are otherwise negative except as noted above.    Blood pressure 130/78, pulse 65, height 5\' 11"  (1.803 m), weight 205 lb (93 kg), SpO2 98 %.   General appearance: alert and no distress Neck: no adenopathy, no carotid bruit, no JVD, supple, symmetrical, trachea midline and thyroid not enlarged, symmetric, no tenderness/mass/nodules Lungs: clear to auscultation bilaterally Heart: regular rate and rhythm, S1, S2 normal, no murmur, click, rub or gallop Extremities: extremities normal, atraumatic, no cyanosis or edema Pulses: 2+ and symmetric Skin: Skin color, texture, turgor normal. No rashes or lesions Neurologic: Alert and oriented X 3, normal strength and tone. Normal symmetric reflexes. Normal coordination and gait  EKG sinus rhythm at 65 with left axis deviation.  I personally reviewed this EKG.  ASSESSMENT AND PLAN:   Hypertrophic cardiomyopathy History of basal septal hypertrophy by 2D echo performed 04/15/2017 with no significant LVOT obstruction noted at rest.  The echo had remained stable compared to one performed in 2015.  He does complain of some mild dyspnea when walking up a hill.  I am going to repeat a 2D echocardiogram.  Hyperlipidemia History of hyperlipidemia on statin therapy with lipid profile recently performed 06/11/2020 revealing total cholesterol 110, LDL 49 and HDL 34.  Elevated coronary artery calcium score Coronary calcium score of 251 performed by his PCP 04/07/2018 with calcium principally in the LAD territory.  He is completely asymptomatic.  His LDL is 49 by recent lipid profile 06/11/2018.      Lorretta Harp MD FACP,FACC,FAHA, [facility] 10/03/2020 8:28 AM

## 2020-10-03 NOTE — Assessment & Plan Note (Signed)
Coronary calcium score of 251 performed by his PCP 04/07/2018 with calcium principally in the LAD territory.  He is completely asymptomatic.  His LDL is 49 by recent lipid profile 06/11/2018.

## 2020-10-03 NOTE — Patient Instructions (Addendum)
Medication Instructions:  Your physician recommends that you continue on your current medications as directed. Please refer to the Current Medication list given to you today.  *If you need a refill on your cardiac medications before your next appointment, please call your pharmacy*   Testing/Procedures: Your physician has requested that you have an echocardiogram. Echocardiography is a painless test that uses sound waves to create images of your heart. It provides your doctor with information about the size and shape of your heart and how well your heart's chambers and valves are working. This procedure takes approximately one hour. There are no restrictions for this procedure. This procedure is done at [address]    Follow-Up: At [facility], you and your health needs are our priority.  As part of our continuing mission to provide you with exceptional heart care, we have created designated Provider Care Teams.  These Care Teams include your primary Cardiologist (physician) and Advanced Practice Providers (APPs -  Physician Assistants and Nurse Practitioners) who all work together to provide you with the care you need, when you need it.  We recommend signing up for the patient portal called "MyChart".  Sign up information is provided on this After Visit Summary.  MyChart is used to connect with patients for Virtual Visits (Telemedicine).  Patients are able to view lab/test results, encounter notes, upcoming appointments, etc.  Non-urgent messages can be sent to your provider as well.   To learn more about what you can do with MyChart, go to [email or web address].    Your next appointment:   12 month(s)  The format for your next appointment:   In Person  Provider:   Jonathan Berry, MD 

## 2020-10-03 NOTE — Assessment & Plan Note (Signed)
History of basal septal hypertrophy by 2D echo performed 04/15/2017 with no significant LVOT obstruction noted at rest.  The echo had remained stable compared to one performed in 2015.  He does complain of some mild dyspnea when walking up a hill.  I am going to repeat a 2D echocardiogram.

## 2020-10-06 DIAGNOSIS — M545 Low back pain, unspecified: Secondary | ICD-10-CM | POA: Diagnosis not present

## 2020-10-06 DIAGNOSIS — M4807 Spinal stenosis, lumbosacral region: Secondary | ICD-10-CM | POA: Diagnosis not present

## 2020-10-09 DIAGNOSIS — M4807 Spinal stenosis, lumbosacral region: Secondary | ICD-10-CM | POA: Diagnosis not present

## 2020-10-09 DIAGNOSIS — M545 Low back pain, unspecified: Secondary | ICD-10-CM | POA: Diagnosis not present

## 2020-10-13 DIAGNOSIS — M545 Low back pain, unspecified: Secondary | ICD-10-CM | POA: Diagnosis not present

## 2020-10-13 DIAGNOSIS — M4807 Spinal stenosis, lumbosacral region: Secondary | ICD-10-CM | POA: Diagnosis not present

## 2020-10-20 DIAGNOSIS — M545 Low back pain, unspecified: Secondary | ICD-10-CM | POA: Diagnosis not present

## 2020-10-20 DIAGNOSIS — M4807 Spinal stenosis, lumbosacral region: Secondary | ICD-10-CM | POA: Diagnosis not present

## 2020-10-27 DIAGNOSIS — M4807 Spinal stenosis, lumbosacral region: Secondary | ICD-10-CM | POA: Diagnosis not present

## 2020-10-27 DIAGNOSIS — M545 Low back pain, unspecified: Secondary | ICD-10-CM | POA: Diagnosis not present

## 2020-10-29 ENCOUNTER — Other Ambulatory Visit: Payer: Self-pay

## 2020-10-29 MED ORDER — ATENOLOL 25 MG PO TABS: 25.0000 mg | ORAL_TABLET | Freq: Every day | ORAL | 3 refills | Status: DC

## 2020-10-31 ENCOUNTER — Ambulatory Visit (HOSPITAL_COMMUNITY): Payer: Medicare Other | Attending: Cardiology

## 2020-10-31 ENCOUNTER — Other Ambulatory Visit: Payer: Self-pay

## 2020-10-31 DIAGNOSIS — I422 Other hypertrophic cardiomyopathy: Secondary | ICD-10-CM | POA: Insufficient documentation

## 2020-10-31 DIAGNOSIS — E782 Mixed hyperlipidemia: Secondary | ICD-10-CM | POA: Insufficient documentation

## 2020-10-31 LAB — ECHOCARDIOGRAM COMPLETE
Area-P 1/2: 2.56 cm2
S' Lateral: 2.2 cm

## 2020-11-04 DIAGNOSIS — M545 Low back pain, unspecified: Secondary | ICD-10-CM | POA: Diagnosis not present

## 2020-11-04 DIAGNOSIS — M4807 Spinal stenosis, lumbosacral region: Secondary | ICD-10-CM | POA: Diagnosis not present

## 2020-11-18 DIAGNOSIS — M4807 Spinal stenosis, lumbosacral region: Secondary | ICD-10-CM | POA: Diagnosis not present

## 2020-11-18 DIAGNOSIS — M545 Low back pain, unspecified: Secondary | ICD-10-CM | POA: Diagnosis not present

## 2020-12-03 DIAGNOSIS — M545 Low back pain, unspecified: Secondary | ICD-10-CM | POA: Diagnosis not present

## 2020-12-03 DIAGNOSIS — M4807 Spinal stenosis, lumbosacral region: Secondary | ICD-10-CM | POA: Diagnosis not present

## 2020-12-30 DIAGNOSIS — H35342 Macular cyst, hole, or pseudohole, left eye: Secondary | ICD-10-CM | POA: Diagnosis not present

## 2020-12-30 DIAGNOSIS — H442A2 Degenerative myopia with choroidal neovascularization, left eye: Secondary | ICD-10-CM | POA: Diagnosis not present

## 2020-12-30 DIAGNOSIS — H442E3 Degenerative myopia with other maculopathy, bilateral eye: Secondary | ICD-10-CM | POA: Diagnosis not present

## 2020-12-30 DIAGNOSIS — H35372 Puckering of macula, left eye: Secondary | ICD-10-CM | POA: Diagnosis not present

## 2021-02-03 DIAGNOSIS — L812 Freckles: Secondary | ICD-10-CM | POA: Diagnosis not present

## 2021-02-03 DIAGNOSIS — L820 Inflamed seborrheic keratosis: Secondary | ICD-10-CM | POA: Diagnosis not present

## 2021-02-03 DIAGNOSIS — L821 Other seborrheic keratosis: Secondary | ICD-10-CM | POA: Diagnosis not present

## 2021-02-03 DIAGNOSIS — D1801 Hemangioma of skin and subcutaneous tissue: Secondary | ICD-10-CM | POA: Diagnosis not present

## 2021-02-03 DIAGNOSIS — D225 Melanocytic nevi of trunk: Secondary | ICD-10-CM | POA: Diagnosis not present

## 2021-02-18 DIAGNOSIS — M4712 Other spondylosis with myelopathy, cervical region: Secondary | ICD-10-CM | POA: Diagnosis not present

## 2021-02-25 DIAGNOSIS — M47812 Spondylosis without myelopathy or radiculopathy, cervical region: Secondary | ICD-10-CM | POA: Diagnosis not present

## 2021-02-25 DIAGNOSIS — M4802 Spinal stenosis, cervical region: Secondary | ICD-10-CM | POA: Diagnosis not present

## 2021-02-25 DIAGNOSIS — M4712 Other spondylosis with myelopathy, cervical region: Secondary | ICD-10-CM | POA: Diagnosis not present

## 2021-03-04 DIAGNOSIS — Z6828 Body mass index (BMI) 28.0-28.9, adult: Secondary | ICD-10-CM | POA: Diagnosis not present

## 2021-03-04 DIAGNOSIS — M4712 Other spondylosis with myelopathy, cervical region: Secondary | ICD-10-CM | POA: Diagnosis not present

## 2021-03-12 ENCOUNTER — Encounter: Payer: Self-pay | Admitting: Neurology

## 2021-03-12 ENCOUNTER — Ambulatory Visit (INDEPENDENT_AMBULATORY_CARE_PROVIDER_SITE_OTHER): Payer: Medicare Other | Admitting: Neurology

## 2021-03-12 VITALS — BP 129/78 | HR 71 | Ht 71.0 in | Wt 203.2 lb

## 2021-03-12 DIAGNOSIS — G4719 Other hypersomnia: Secondary | ICD-10-CM | POA: Diagnosis not present

## 2021-03-12 DIAGNOSIS — R7309 Other abnormal glucose: Secondary | ICD-10-CM

## 2021-03-12 DIAGNOSIS — M79662 Pain in left lower leg: Secondary | ICD-10-CM | POA: Diagnosis not present

## 2021-03-12 DIAGNOSIS — M79661 Pain in right lower leg: Secondary | ICD-10-CM | POA: Diagnosis not present

## 2021-03-12 DIAGNOSIS — E531 Pyridoxine deficiency: Secondary | ICD-10-CM

## 2021-03-12 DIAGNOSIS — G8929 Other chronic pain: Secondary | ICD-10-CM

## 2021-03-12 DIAGNOSIS — R931 Abnormal findings on diagnostic imaging of heart and coronary circulation: Secondary | ICD-10-CM

## 2021-03-12 DIAGNOSIS — E559 Vitamin D deficiency, unspecified: Secondary | ICD-10-CM | POA: Diagnosis not present

## 2021-03-12 DIAGNOSIS — M25562 Pain in left knee: Secondary | ICD-10-CM

## 2021-03-12 DIAGNOSIS — M6289 Other specified disorders of muscle: Secondary | ICD-10-CM

## 2021-03-12 DIAGNOSIS — R200 Anesthesia of skin: Secondary | ICD-10-CM

## 2021-03-12 DIAGNOSIS — G603 Idiopathic progressive neuropathy: Secondary | ICD-10-CM | POA: Diagnosis not present

## 2021-03-12 DIAGNOSIS — G6189 Other inflammatory polyneuropathies: Secondary | ICD-10-CM | POA: Diagnosis not present

## 2021-03-12 DIAGNOSIS — M25561 Pain in right knee: Secondary | ICD-10-CM | POA: Diagnosis not present

## 2021-03-12 NOTE — Patient Instructions (Addendum)
- Knee pain and calf tightness: Feel this may be different than the foot numbness. Will check for DVTs(will call for appt). Xrays of bilateral knees(walk in, information given).  Will send to Dr. Maureen Ralphs for evaluation of knee pain and possible injections - Foot numbness: Will perform a thorough serum evaluation for causes of neuropathy. EMG/NCS  - Hand pain: Will check for autoimmune disorders like RF(mother had debilitating arthritis). EMG/NCS for possible CTS. of one arm and one leg. If Carpal Tunnel in the arm will also include the other arm. - Hypertrophic cardiomyopathy: Transthyretin Amyloid Cardiomyopathy? Familial Transthyretin can also cause neuropathy. Transthyretin gene - can order via [employer] will send to home. Can ask cardiologist for an amyloid cardiac scan as clinically warranted. Will let you know. - Consider vascular studies lower extremities, varicose veins and other vascular problems can cause lower extremity pain however this is less likely     Peripheral Neuropathy Peripheral neuropathy is a type of nerve damage. It affects nerves that carry signals between the spinal cord and the arms, legs, and the rest of the body (peripheral nerves). It does not affect nerves in the spinal cord or brain. In peripheral neuropathy, one nerve or a group of nerves may be damaged. Peripheral neuropathy is a broad category that includes many specific nerve disorders, like diabetic neuropathy, hereditary neuropathy, and carpal tunnel syndrome. What are the causes? This condition may be caused by: Diabetes. This is the most common cause of peripheral neuropathy. Nerve injury. Pressure or stress on a nerve that lasts a long time. Lack (deficiency) of B vitamins. This can result from alcoholism, poor diet, or a restricted diet. Infections. Autoimmune diseases, such as rheumatoid arthritis and systemic lupus erythematosus. Nerve diseases that are passed from parent to child (inherited). Some  medicines, such as cancer medicines (chemotherapy). Poisonous (toxic) substances, such as lead and mercury. Too little blood flowing to the legs. Kidney disease. Thyroid disease. In some cases, the cause of this condition is not known. What are the signs or symptoms? Symptoms of this condition depend on which of your nerves is damaged. Common symptoms include: Loss of feeling (numbness) in the feet, hands, or both. Tingling in the feet, hands, or both. Burning pain. Very sensitive skin. Weakness. Not being able to move a part of the body (paralysis). Muscle twitching. Clumsiness or poor coordination. Loss of balance. Not being able to control your bladder. Feeling dizzy. Sexual problems. How is this diagnosed? Diagnosing and finding the cause of peripheral neuropathy can be difficult. Your health care provider will take your medical history and do a physical exam. A neurological exam will also be done. This involves checking things that are affected by your brain, spinal cord, and nerves (nervous system). For example, your health care provider will check your reflexes, how you move, and what you can feel. You may have other tests, such as: Blood tests. Electromyogram (EMG) and nerve conduction tests. These tests check nerve function and how well the nerves are controlling the muscles. Imaging tests, such as CT scans or MRI to rule out other causes of your symptoms. Removing a small piece of nerve to be examined in a lab (nerve biopsy). Removing and examining a small amount of the fluid that surrounds the brain and spinal cord (lumbar puncture). How is this treated? Treatment for this condition may involve: Treating the underlying cause of the neuropathy, such as diabetes, kidney disease, or vitamin deficiencies. Stopping medicines that can cause neuropathy, such as chemotherapy. Medicine to  help relieve pain. Medicines may include: Prescription or over-the-counter pain  medicine. Antiseizure medicine. Antidepressants. Pain-relieving patches that are applied to painful areas of skin. Surgery to relieve pressure on a nerve or to destroy a nerve that is causing pain. Physical therapy to help improve movement and balance. Devices to help you move around (assistive devices). Follow these instructions at home: Medicines Take over-the-counter and prescription medicines only as told by your health care provider. Do not take any other medicines without first asking your health care provider. Do not drive or use heavy machinery while taking prescription pain medicine. Lifestyle  Do not use any products that contain nicotine or tobacco, such as cigarettes and e-cigarettes. Smoking keeps blood from reaching damaged nerves. If you need help quitting, ask your health care provider. Avoid or limit alcohol. Too much alcohol can cause a vitamin B deficiency, and vitamin B is needed for healthy nerves. Eat a healthy diet. This includes: Eating foods that are high in fiber, such as fresh fruits and vegetables, whole grains, and beans. Limiting foods that are high in fat and processed sugars, such as fried or sweet foods. General instructions  If you have diabetes, work closely with your health care provider to keep your blood sugar under control. If you have numbness in your feet: Check every day for signs of injury or infection. Watch for redness, warmth, and swelling. Wear padded socks and comfortable shoes. These help protect your feet. Develop a good support system. Living with peripheral neuropathy can be stressful. Consider talking with a mental health specialist or joining a support group. Use assistive devices and attend physical therapy as told by your health care provider. This may include using a walker or a cane. Keep all follow-up visits as told by your health care provider. This is important. Contact a health care provider if: You have new signs or symptoms  of peripheral neuropathy. You are struggling emotionally from dealing with peripheral neuropathy. Your pain is not well-controlled. Get help right away if: You have an injury or infection that is not healing normally. You develop new weakness in an arm or leg. You have fallen or do so frequently. Summary Peripheral neuropathy is when the nerves in the arms, or legs are damaged, resulting in numbness, weakness, or pain. There are many causes of peripheral neuropathy, including diabetes, pinched nerves, vitamin deficiencies, autoimmune disease, and hereditary conditions. Diagnosing and finding the cause of peripheral neuropathy can be difficult. Your health care provider will take your medical history, do a physical exam, and do tests, including blood tests and nerve function tests. Treatment involves treating the underlying cause of the neuropathy and taking medicines to help control pain. Physical therapy and assistive devices may also help. This information is not intended to replace advice given to you by your health care provider. Make sure you discuss any questions you have with your health care provider. Document Revised: 03/04/2020 Document Reviewed: 03/04/2020 Elsevier Patient Education  2022 [place] is a test to check how well your muscles and nerves are working. This procedure includes the combined use of electromyogram (EMG) and nerve conduction study (NCS). EMG is used to look for muscular disorders. NCS, which is also called electroneurogram, measures how well your nerves are controlling your muscles. The procedures are usually done together to check if your muscles and nerves are healthy. If the results of the tests are abnormal, this may indicate disease or injury, such as a neuromuscular disease or peripheral nerve  damage. Tell a health care provider about: Any allergies you have. All medicines you are taking, including vitamins,  herbs, eye drops, creams, and over-the-counter medicines. Any problems you or family members have had with anesthetic medicines. Any blood disorders you have. Any surgeries you have had. Any medical conditions you have. If you have a pacemaker. Whether you are pregnant or may be pregnant. What are the risks? Generally, this is a safe procedure. However, problems may occur, including: Infection where the electrodes were inserted. Bleeding. What happens before the procedure? Medicines Ask your health care provider about: Changing or stopping your regular medicines. This is especially important if you are taking diabetes medicines or blood thinners. Taking medicines such as aspirin and ibuprofen. These medicines can thin your blood. Do not take these medicines unless your health care provider tells you to take them. Taking over-the-counter medicines, vitamins, herbs, and supplements. General instructions Your health care provider may ask you to avoid: Beverages that have caffeine, such as coffee and tea. Any products that contain nicotine or tobacco. These products include cigarettes, e-cigarettes, and chewing tobacco. If you need help quitting, ask your health care provider. Do not use lotions or creams on the same day that you will be having the procedure. What happens during the procedure? For EMG  Your health care provider will ask you to stay in a position so that he or she can access the muscle that will be studied. You may be standing, sitting, or lying down. You may be given a medicine that numbs the area (local anesthetic). A very thin needle that has an electrode will be inserted into your muscle. Another small electrode will be placed on your skin near the muscle. Your health care provider will ask you to continue to remain still. The electrodes will send a signal that tells about the electrical activity of your muscles. You may see this on a monitor or hear it in the  room. After your muscles have been studied at rest, your health care provider will ask you to contract or flex your muscles. The electrodes will send a signal that tells about the electrical activity of your muscles. Your health care provider will remove the electrodes and the electrode needles when the procedure is finished. The procedure may vary among health care providers and hospitals. For NCS  An electrode that records your nerve activity (recording electrode) will be placed on your skin by the muscle that is being studied. An electrode that is used as a reference (reference electrode) will be placed near the recording electrode. A paste or gel will be applied to your skin between the recording electrode and the reference electrode. Your nerve will be stimulated with a mild shock. Your health care provider will measure how much time it takes for your muscle to react. Your health care provider will remove the electrodes and the gel when the procedure is finished. The procedure may vary among health care providers and hospitals. What happens after the procedure? It is up to you to get the results of your procedure. Ask your health care provider, or the department that is doing the procedure, when your results will be ready. Your health care provider may: Give you medicines for any pain. Monitor the insertion sites to make sure that bleeding stops. Summary Electromyoneurogram is a test to check how well your muscles and nerves are working. If the results of the tests are abnormal, this may indicate disease or injury. This is a safe procedure.  However, problems may occur, such as bleeding and infection. Your health care provider will do two tests to complete this procedure. One checks your muscles (EMG) and another checks your nerves (NCS). It is up to you to get the results of your procedure. Ask your health care provider, or the department that is doing the procedure, when your results will  be ready. This information is not intended to replace advice given to you by your health care provider. Make sure you discuss any questions you have with your health care provider. Document Revised: 02/07/2018 Document Reviewed: 01/20/2018 Else[employer] Patient Education  [place].

## 2021-03-12 NOTE — Progress Notes (Signed)
[facility]    Provider:  Dr Jaynee Eagles Requesting Provider: [name], MD Primary Care Provider:  [name], MD  CC:  Neuropathy  HPI:  Albert Alvarez is a 70 y.o. male here as requested by my wonderful colleague  Albert Miss, MD for neuropathy.  He has a past medical history of degenerative joint disease, hypertrophic cardiomyopathy, kidney stones, macular degeneration, back surgery, trigger finger, elevated coronary artery calcium score and hyperlipidemia.  I reviewed patient's chart, he was seen by Dr. Kristeen Alvarez at [facility] neurosurgery who has sent him here for progressive neuropathy and dysfunction with his lower extremities and particularly.  Dr. Ellene Route evaluated patient with an MRI of his lumbar spine which showed some diffuse spondylitic changes but no evidence of any neural compression, he had an MRI cervical in 2019 that demonstrated spondylitic changes at C5-C6 and then C6-C7 with some early cord flattening and the concern was that maybe he had developed some progressive myelopathy.  Patient reported to Dr. Ellene Route a generalized feeling of weakness in his lower extremities with a heaviness in his feet, walking is not a problem but he has difficulty with pain mostly at night, severe enough it does interrupt his sleep and he did have some reprieve with gabapentin but still having progressive symptoms.  Per Dr. Ellene Route, the MRI that was obtained in the past week was reviewed and demonstrated the same changes at C5-C6 and C6-C7 particularly with some effacement of the cord and flattening of the ventral aspect more on the right than the left but no cord signal changes and no evidence of any progression of this process from prior MRI cervical spine and current symptoms seem to be neuropathic in nature as opposed to actual cord compression or physical impingement.  Dr. Ellene Route noted his lower extremity reflexes were essentially absent which was not consistent with any kind of  myelopathy, and he was sent for evaluation here in neurology.  In his opinion he did not see a lesion that needed decompression, stabilization or other intervention.  At prior appointments patient also discussed increasing hand numbness, cramping in his lower extremities.  3 years ago he had an EMG nerve conduction study which was completely within normal limits.   2-3 years he has had numbness in the feet, in December started to increase, at night he would have knee pain radiating to the bottom of the foot, he was having trouble sleeping he would have to get up and walk around and take tylenol, felt like his muscles were tight, his knees would ache, his feet would tingle on the bottom and top of the feet, extreme numbness and imbalance a lot of the time. No falls but he feels imbalanced. Wife is here and provides information. This is not like his low back pain such as when he had to have surgery. At night is when he suffers the most. He had physical therapy. Didn't help at all. He mostly complains of extreme calfs in the night and stretching his calfs at night can give some temporary relief and massage of the feet and calfs. He denies any restless leg symptoms, Whole bottom of both feet are numb symmetrically and the toes but all over the feet sensory changes, no cramping in the feet and he doesn't feel it is cramping in the calfs or the feet. He has a lot of knee problems as well with aching in the kness and in the whole leg. The numbness in the feet may be independent  of the knees. The knees are intermittent but the numbness in the feet is continuous. He has sensory symptoms in the hands, worse at night, more tightness in the hands. No FHx of nerve disease or autoimmune disorders. Father was diabetic. No neuromuscular family history. Mother had crippling arthritis. No history of smoking. No hx of alcohol use. Can walk 1-2 miles without pain but symptoms are worse in the evenings, but the feet are always numb.  Feet feel cold at bedtime.   Reviewed notes, labs and imaging from outside physicians, which showed: see above  Review of Systems: Patient complains of symptoms per HPI as well as the following symptoms leg pain. Pertinent negatives and positives per HPI. All others negative.   Social History   Socioeconomic History   Marital status: Married    Spouse name: Not on file   Number of children: 3   Years of education: Not on file   Highest education level: Not on file  Occupational History   Occupation: [employer]    Employer: [employer]   Occupation: Optometrist    Comment: [place]  Tobacco Use   Smoking status: Never   Smokeless tobacco: Never  Vaping Use   Vaping Use: Never used  Substance and Sexual Activity   Alcohol use: No   Drug use: Never   Sexual activity: Yes  Other Topics Concern   Not on file  Social History Narrative   Caffeine- one diet coke daily, iced tea approx 2 glasses daily.  Education: 4 yr college post grad. Administrator, arts.  Wife= Albert Alvarez.  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    Social Determinants of Health   Financial Resource Strain: Not on file  Food Insecurity: Not on file  Transportation Needs: Not on file  Physical Activity: Not on file  Stress: Not on file  Social Connections: Not on file  Intimate Partner Violence: Not on file    Family History  Problem Relation Age of Onset   Arrhythmia Father     Past Medical History:  Diagnosis Date   Detached retina    DJD (degenerative joint disease)    spine   Hypertrophic cardiomyopathy ([place])    Kidney stones    Macular degeneration     Patient Active Problem List   Diagnosis Date Noted   Numbness 03/15/2021   Muscle stiffness 03/15/2021   Elevated coronary artery calcium score 10/03/2020   Hyperlipidemia 07/07/2018   LVH (left ventricular hypertrophy) 10/18/2012   Hypertrophic cardiomyopathy ([place]) 07/14/2011    Past Surgical History:  Procedure Laterality Date   [place] Right 05/22/2019   Procedure: RELEASE TRIGGER FINGER/A-1 PULLEY RIGHT RING FINGER;  Surgeon: Albert Brod, MD;  Location: [facility];  Service: Orthopedics;  Laterality: Right;  IV REGIONAL FOREARM BLOCK   US ECHOCARDIOGRAPHY  May 27, 2050   EF 61%    Current Outpatient Medications  Medication Sig Dispense Refill   atenolol (TENORMIN) 25 MG tablet Take 1 tablet (25 mg total) by mouth daily. 90 tablet 3   BIOFLAVONOID PRODUCTS PO Take 1,000 mg by mouth daily.     Cholecalciferol (VITAMIN D PO) Take 4,000 Int'l Units/L by mouth daily.     gabapentin (NEURONTIN) 300 MG capsule Take 300 mg by mouth 3 (three) times daily.     Multiple Vitamin (MULTIVITAMIN) tablet Take 1 tablet by mouth daily.     OVER THE COUNTER MEDICATION Take 1 capsule by mouth  daily at 6 (six) AM. Tumeric 274m, Pine Bark Extract 109m Ginger Root Extract 15033m   rosuvastatin (CRESTOR) 10 MG tablet      No current facility-administered medications for this visit.    Allergies as of 03/12/2021 - Review Complete 03/12/2021  Allergen Reaction Noted   Codeine  03/15/2011   Nsaids  03/12/2021   Shellfish allergy  03/12/2021    Vitals: BP 129/78   Pulse 71   Ht 5' 11"  (1.803 m)   Wt 203 lb 3.2 oz (92.2 kg)   BMI 28.34 kg/m  Last Weight:  Wt Readings from Last 1 Encounters:  03/12/21 203 lb 3.2 oz (92.2 kg)   Last Height:   Ht Readings from Last 1 Encounters:  03/12/21 5' 11"  (1.803 m)     Physical exam: Exam: Gen: NAD, conversant, well nourised, overweight, well groomed                     CV: RRR, no MRG. No Carotid Bruits. No peripheral edema, warm, nontender Eyes: Conjunctivae clear without exudates or hemorrhage  Neuro: Detailed Neurologic Exam  Speech:    Speech is normal; fluent and spontaneous with normal comprehension.  Cognition:    The patient is oriented to person, place, and time;     recent and remote memory intact;     language  fluent;     normal attention, concentration,     fund of knowledge Cranial Nerves:    The pupils are equal, round, and reactive to light.  Pupils too small to visualize fundi. Visual fields are full to finger confrontation left and slightly impaired right (macular degeneration). Extraocular movements are intact. Trigeminal sensation is intact and the muscles of mastication are normal. The face is symmetric. The palate elevates in the midline. Hearing intact. Voice is normal. Shoulder shrug is normal. The tongue has normal motion without fasciculations.   Coordination:    Normal finger to nose    Gait:    Heel-toe and tandem gait are normal with some mild imbalance. Gait was normal, good stance and arm swing and good turn.   Motor Observation:    No asymmetry, no atrophy, and no involuntary movements noted. Tone:    Normal muscle tone.    Posture:   Posture is normal. normal erect    Strength:    Strength is V/V in the upper and lower limbs.      Sensation: intact to LT. Romberg negative. Slight decrease to pin prick just in the distal toes. Distal decrease slightly in temp but can appreciate coolness in the feet. 4 seconds vibration at the distal great toe.      Reflex Exam:  DTR's:     Absent AJs. Trace to 1+ patellars and uppers.  Toes:    The toes are downgoing bilaterally.   Clonus:    Clonus is absent.    Assessment/Plan:   HugMATAS BURROWS a 70 45o. male here as requested by my wonderful colleague  ElsKristeen MissD for neuropathy.  He has a past medical history of degenerative joint disease, hypertrophic cardiomyopathy, kidney stones, macular degeneration, back surgery, trigger finger, elevated coronary artery calcium score and hyperlipidemia. He has knee pain an lower leg pain with sensory symptoms in the feet. He has calf pain, muscle pain there. Also knee pain. His feet feel mostly numb but also paresthesias.   - Very sleepy, anytime during the day with sitting will nod  off, no significant  snoring but wakes with dry mouth, has idiopathic hypertrophic cardiomyopathy. ESS 19. Sleep referral for OSA.  - Knee pain and calf tightness: Feel this may be different than the foot numbness. Will check for DVTs(will call for appt). Xrays of bilateral knees(walk in, information given).  Will send to Dr. Maureen Ralphs for evaluation of knee pain and possible injections - Foot numbness: Will perform a thorough serum evaluation for causes of neuropathy. And EMG/NCS  - Hand pain: Will check for autoimmune disorders like RF(mother had debilitating arthritis). EMG/NCS for possible CTS. of one arm and one leg. If Carpal Tunnel in the arm will also include the other arm. - Hypertrophic cardiomyopathy: Consider Transthyretin Amyloid Cardiomyopathy? Familial Transthyretin can also cause neuropathy. -Transthyretin gene - can order via [facility] will send to home. Can ask cardiologist for an amyloid cardiac scan as clinically warranted. Will let you know. - Consider vascular studies lower extremities, varicose veins and other vascular problems can cause lower extremity pain however this is less likely   Orders Placed This Encounter  Procedures   DG Knee Complete 4 Views Left   DG Knee Complete 4 Views Right   Sedimentation rate   C-reactive protein   Hemoglobin A1c   B12 and Folate Panel   Vitamin B1   Vitamin B6   Vitamin D, 25-hydroxy   ANA Comprehensive Panel   Sjogren's syndrome antibods(ssa + ssb)   Rheumatoid factor   ANA, IFA (with reflex)   Heavy metals, blood   CBC with Differential/Platelets   Comprehensive metabolic panel   TSH   CK   Multiple Myeloma Panel (SPEP&IFE w/QIG)   Magnesium   Angiotensin converting enzyme   Hepatitis C antibody   Copper, serum   Zinc   Methylmalonic acid, serum   Ambulatory referral to Sleep Studies   Ambulatory referral to Orthopedic Surgery   NCV with EMG(electromyography)   VAS [place] LOWER EXTREMITY VENOUS (DVT)    No orders of the  defined types were placed in this encounter.   Cc: Albert Miss, MD,  Albert Infante, MD  Sarina Ill, MD  [facility] [address] [place], [place] [address]  Phone [phone] Fax [phone]  I spent over 110 minutes of face-to-face and non-face-to-face time with patient on the  1. Muscle stiffness   2. Numbness   3. Other inflammatory polyneuropathies ([place])   4. Idiopathic progressive polyneuropathy   5. screening for Vitamin deficiency   6. screening for diabetes   7. screening for Vitamin deficiency   8. Chronic pain of both knees   9. Bilateral calf pain   10. Excessive daytime sleepiness    diagnosis.  This included previsit chart review, lab review, study review, order entry, electronic health record documentation, patient education on the different diagnostic and therapeutic options, counseling and coordination of care, risks and benefits of management, compliance, or risk factor reduction

## 2021-03-15 ENCOUNTER — Encounter: Payer: Self-pay | Admitting: Neurology

## 2021-03-15 DIAGNOSIS — M6289 Other specified disorders of muscle: Secondary | ICD-10-CM | POA: Insufficient documentation

## 2021-03-15 DIAGNOSIS — R200 Anesthesia of skin: Secondary | ICD-10-CM | POA: Insufficient documentation

## 2021-03-16 ENCOUNTER — Other Ambulatory Visit: Payer: Self-pay | Admitting: Neurology

## 2021-03-16 DIAGNOSIS — I43 Cardiomyopathy in diseases classified elsewhere: Secondary | ICD-10-CM

## 2021-03-16 DIAGNOSIS — E854 Organ-limited amyloidosis: Secondary | ICD-10-CM

## 2021-03-17 ENCOUNTER — Telehealth: Payer: Self-pay | Admitting: *Deleted

## 2021-03-17 ENCOUNTER — Telehealth: Payer: Self-pay | Admitting: Neurology

## 2021-03-17 ENCOUNTER — Other Ambulatory Visit: Payer: Self-pay

## 2021-03-17 DIAGNOSIS — I422 Other hypertrophic cardiomyopathy: Secondary | ICD-10-CM

## 2021-03-17 DIAGNOSIS — I517 Cardiomegaly: Secondary | ICD-10-CM

## 2021-03-17 NOTE — Telephone Encounter (Signed)
Sent to [facility] for Dr. Wynelle Link ph # [phone].

## 2021-03-17 NOTE — Telephone Encounter (Addendum)
Called patient and LVM (ok per DPR) advising an order was sent to [facility] for a free Transthyretin Amyloidosis (TTR) genetic test.  I advised [facility] will reach out to him and will mail him the kit for saliva specimen collection and he will need to follow directions and mail the kit back to [facility]. Left office number in message for call back if he has questions.   Order sent to [facility] for ATTR panel. Included message asking for kit to be mailed to pt and for pt to be called. Received a receipt of confirmation.    ----- Message from Melvenia Beam, MD sent at 03/15/2021  9:01 PM EDT ----- Regarding: Transthyretin gene Can we send an [facility] test just for transthyretin gene?   FYI: Familial transthyretin (ATTR) amyloidosis is a genetic disorder that is caused by pathogenic variants in the TTR gene and results in amyloid deposits consisting of mutated TTR. 1  It is characterized by progressive peripheral sensorimotor or autonomic neuropathy, with nonneuropathic changes including cardiomyopathy, nephropathy, vitreous opacities, and central nervous system amyloidosis

## 2021-03-18 LAB — ZINC: Zinc: 82 ug/dL (ref 44–115)

## 2021-03-18 LAB — CBC WITH DIFFERENTIAL/PLATELET
Basophils Absolute: 0.1 10*3/uL (ref 0.0–0.2)
Basos: 1 %
EOS (ABSOLUTE): 0.2 10*3/uL (ref 0.0–0.4)
Eos: 3 %
Hematocrit: 43.7 % (ref 37.5–51.0)
Hemoglobin: 15.1 g/dL (ref 13.0–17.7)
Immature Grans (Abs): 0 10*3/uL (ref 0.0–0.1)
Immature Granulocytes: 0 %
Lymphocytes Absolute: 1 10*3/uL (ref 0.7–3.1)
Lymphs: 13 %
MCH: 33.6 pg — ABNORMAL HIGH (ref 26.6–33.0)
MCHC: 34.6 g/dL (ref 31.5–35.7)
MCV: 97 fL (ref 79–97)
Monocytes Absolute: 0.7 10*3/uL (ref 0.1–0.9)
Monocytes: 10 %
Neutrophils Absolute: 5.6 10*3/uL (ref 1.4–7.0)
Neutrophils: 73 %
Platelets: 249 10*3/uL (ref 150–450)
RBC: 4.49 x10E6/uL (ref 4.14–5.80)
RDW: 11.8 % (ref 11.6–15.4)
WBC: 7.7 10*3/uL (ref 3.4–10.8)

## 2021-03-18 LAB — COMPREHENSIVE METABOLIC PANEL
ALT: 26 IU/L (ref 0–44)
AST: 21 IU/L (ref 0–40)
Albumin/Globulin Ratio: 2 (ref 1.2–2.2)
Albumin: 4.9 g/dL — ABNORMAL HIGH (ref 3.7–4.7)
Alkaline Phosphatase: 110 IU/L (ref 44–121)
BUN/Creatinine Ratio: 15 (ref 10–24)
BUN: 19 mg/dL (ref 8–27)
Bilirubin Total: 0.5 mg/dL (ref 0.0–1.2)
CO2: 24 mmol/L (ref 20–29)
Calcium: 10.5 mg/dL — ABNORMAL HIGH (ref 8.6–10.2)
Chloride: 103 mmol/L (ref 96–106)
Creatinine, Ser: 1.25 mg/dL (ref 0.76–1.27)
Globulin, Total: 2.5 g/dL (ref 1.5–4.5)
Glucose: 89 mg/dL (ref 70–99)
Potassium: 4.8 mmol/L (ref 3.5–5.2)
Sodium: 142 mmol/L (ref 134–144)
Total Protein: 7.4 g/dL (ref 6.0–8.5)
eGFR: 62 mL/min/{1.73_m2} (ref >59)

## 2021-03-18 LAB — VITAMIN B6: Vitamin B6: 28.5 ug/L (ref 3.4–65.2)

## 2021-03-18 LAB — METHYLMALONIC ACID, SERUM: Methylmalonic Acid: 205 nmol/L (ref 0–378)

## 2021-03-18 LAB — MULTIPLE MYELOMA PANEL, SERUM
Albumin SerPl Elph-Mcnc: 4.1 g/dL (ref 2.9–4.4)
Albumin/Glob SerPl: 1.3 (ref 0.7–1.7)
Alpha 1: 0.2 g/dL (ref 0.0–0.4)
Alpha2 Glob SerPl Elph-Mcnc: 0.8 g/dL (ref 0.4–1.0)
B-Globulin SerPl Elph-Mcnc: 1.3 g/dL (ref 0.7–1.3)
Gamma Glob SerPl Elph-Mcnc: 1.1 g/dL (ref 0.4–1.8)
Globulin, Total: 3.3 g/dL (ref 2.2–3.9)
IgA/Immunoglobulin A, Serum: 358 mg/dL (ref 61–437)
IgG (Immunoglobin G), Serum: 1067 mg/dL (ref 603–1613)
IgM (Immunoglobulin M), Srm: 48 mg/dL (ref 15–143)

## 2021-03-18 LAB — HEAVY METALS, BLOOD
Arsenic: 2 ug/L (ref 0–9)
Lead, Blood: <1 ug/dL (ref 0–4)
Mercury: <1 ug/L (ref 0.0–14.9)

## 2021-03-18 LAB — ANA COMPREHENSIVE PANEL
Anti JO-1: <0.2 AI (ref 0.0–0.9)
Centromere Ab Screen: <0.2 AI (ref 0.0–0.9)
Chromatin Ab SerPl-aCnc: <0.2 AI (ref 0.0–0.9)
ENA RNP Ab: <0.2 AI (ref 0.0–0.9)
ENA SM Ab Ser-aCnc: <0.2 AI (ref 0.0–0.9)
ENA SSA (RO) Ab: <0.2 AI (ref 0.0–0.9)
ENA SSB (LA) Ab: <0.2 AI (ref 0.0–0.9)
Scleroderma (Scl-70) (ENA) Antibody, IgG: <0.2 AI (ref 0.0–0.9)
dsDNA Ab: 3 IU/mL (ref 0–9)

## 2021-03-18 LAB — TSH: TSH: 1.75 u[IU]/mL (ref 0.450–4.500)

## 2021-03-18 LAB — B12 AND FOLATE PANEL
Folate: >20 ng/mL (ref >3.0)
Vitamin B-12: 546 pg/mL (ref 232–1245)

## 2021-03-18 LAB — CK: Total CK: 115 U/L (ref 41–331)

## 2021-03-18 LAB — VITAMIN D 25 HYDROXY (VIT D DEFICIENCY, FRACTURES): Vit D, 25-Hydroxy: 86.1 ng/mL (ref 30.0–100.0)

## 2021-03-18 LAB — MAGNESIUM: Magnesium: 2.3 mg/dL (ref 1.6–2.3)

## 2021-03-18 LAB — HEMOGLOBIN A1C
Est. average glucose Bld gHb Est-mCnc: 97 mg/dL
Hgb A1c MFr Bld: 5 % (ref 4.8–5.6)

## 2021-03-18 LAB — COPPER, SERUM: Copper: 107 ug/dL (ref 69–132)

## 2021-03-18 LAB — C-REACTIVE PROTEIN: CRP: 3 mg/L (ref 0–10)

## 2021-03-18 LAB — SEDIMENTATION RATE: Sed Rate: 8 mm/hr (ref 0–30)

## 2021-03-18 LAB — RHEUMATOID FACTOR: Rheumatoid fact SerPl-aCnc: <10 IU/mL (ref <14.0)

## 2021-03-18 LAB — ANTINUCLEAR ANTIBODIES, IFA: ANA Titer 1: NEGATIVE

## 2021-03-18 LAB — VITAMIN B1: Thiamine: 237.4 nmol/L — ABNORMAL HIGH (ref 66.5–200.0)

## 2021-03-18 LAB — ANGIOTENSIN CONVERTING ENZYME: Angio Convert Enzyme: 20 U/L (ref 14–82)

## 2021-03-18 LAB — HEPATITIS C ANTIBODY: Hep C Virus Ab: <0.1 s/co ratio (ref 0.0–0.9)

## 2021-03-23 ENCOUNTER — Other Ambulatory Visit: Payer: Self-pay

## 2021-03-23 ENCOUNTER — Telehealth: Payer: Self-pay | Admitting: *Deleted

## 2021-03-23 ENCOUNTER — Ambulatory Visit (HOSPITAL_COMMUNITY)
Admission: RE | Admit: 2021-03-23 | Discharge: 2021-03-23 | Disposition: A | Discharge status: Home / Self Care | Payer: Medicare Other | Source: Ambulatory Visit | Attending: Neurology | Admitting: Neurology

## 2021-03-23 DIAGNOSIS — M79661 Pain in right lower leg: Secondary | ICD-10-CM | POA: Diagnosis not present

## 2021-03-23 DIAGNOSIS — M79662 Pain in left lower leg: Secondary | ICD-10-CM | POA: Insufficient documentation

## 2021-03-23 NOTE — Telephone Encounter (Signed)
Melvenia Beam, MD  03/23/2021  4:40 PM EDT Back to Top    No deep vein thromboses to account for the calf tightness. thanks

## 2021-03-23 NOTE — Telephone Encounter (Signed)
Received a call from [place] with PRELIMINARY results of lower extremity DVT US. She stated the thrombosis is NOT deep and not near the saphenous/popliteal junction. She states he's had this awhile but its not all the way "chronic". See below as reported in EPIC:  Summary:  RIGHT:  - Findings consistent with age indeterminate superficial vein thrombosis  involving the right small saphenous vein.  - There is no evidence of deep vein thrombosis in the lower extremity.  - No cystic structure found in the popliteal fossa.     LEFT:  - Findings consistent with age indeterminate superficial vein thrombosis  involving the left small sahenous vein.  - There is no evidence of deep vein thrombosis in the lower extremity.  - No cystic structure found in the popliteal fossa.   Final report pending

## 2021-03-24 ENCOUNTER — Telehealth (HOSPITAL_COMMUNITY): Payer: Self-pay | Admitting: *Deleted

## 2021-03-24 NOTE — Telephone Encounter (Signed)
Close encounter 

## 2021-03-24 NOTE — Telephone Encounter (Signed)
Spoke with patient and advised of LE ultrasound results as noted below per Dr Jaynee Eagles indicating no DVT to account for calf tightness. Pt's questions were answered. He is aware there were superficial thrombi found in both legs however not concerning. We discussed sending report over to his PCP to further discuss if he would like. Pt verbalized appreciation for this.   Report sent to Dr Joylene Draft

## 2021-03-25 ENCOUNTER — Other Ambulatory Visit: Payer: Self-pay

## 2021-03-25 ENCOUNTER — Ambulatory Visit (HOSPITAL_COMMUNITY)
Admission: RE | Admit: 2021-03-25 | Discharge: 2021-03-25 | Disposition: A | Discharge status: Home / Self Care | Payer: Medicare Other | Source: Ambulatory Visit | Attending: Cardiology | Admitting: Cardiology

## 2021-03-25 DIAGNOSIS — I422 Other hypertrophic cardiomyopathy: Secondary | ICD-10-CM | POA: Diagnosis not present

## 2021-03-25 DIAGNOSIS — I517 Cardiomegaly: Secondary | ICD-10-CM

## 2021-03-25 MED ORDER — TECHNETIUM TC 99M PYROPHOSPHATE
21.4000 | Freq: Once | INTRAVENOUS | Status: AC
  Administered 2021-03-25: 21.4 via INTRAVENOUS

## 2021-03-30 ENCOUNTER — Telehealth: Payer: Self-pay | Admitting: Cardiovascular Disease

## 2021-03-30 ENCOUNTER — Encounter (HOSPITAL_BASED_OUTPATIENT_CLINIC_OR_DEPARTMENT_OTHER): Payer: Self-pay

## 2021-03-30 DIAGNOSIS — I422 Other hypertrophic cardiomyopathy: Secondary | ICD-10-CM

## 2021-03-30 DIAGNOSIS — I739 Peripheral vascular disease, unspecified: Secondary | ICD-10-CM | POA: Diagnosis not present

## 2021-03-30 DIAGNOSIS — I82813 Embolism and thrombosis of superficial veins of lower extremities, bilateral: Secondary | ICD-10-CM | POA: Diagnosis not present

## 2021-03-30 DIAGNOSIS — Z01812 Encounter for preprocedural laboratory examination: Secondary | ICD-10-CM

## 2021-03-30 DIAGNOSIS — G629 Polyneuropathy, unspecified: Secondary | ICD-10-CM | POA: Diagnosis not present

## 2021-03-30 DIAGNOSIS — Z23 Encounter for immunization: Secondary | ICD-10-CM | POA: Diagnosis not present

## 2021-03-30 NOTE — Telephone Encounter (Signed)
Patient aware of results  Order placed for lab and cardiac MRI

## 2021-03-30 NOTE — Telephone Encounter (Signed)
Patient was calling to get results from the tests he had done last week

## 2021-03-30 NOTE — Telephone Encounter (Signed)
Left message to call back in regards to test results below:   Lorretta Harp, MD  03/26/2021  5:05 PM EDT     Equivocal pyrophosphate test for amyloidosis. Please order cardiac MRI

## 2021-03-30 NOTE — Telephone Encounter (Signed)
Patient returned call

## 2021-04-16 ENCOUNTER — Ambulatory Visit (INDEPENDENT_AMBULATORY_CARE_PROVIDER_SITE_OTHER): Payer: Medicare Other | Admitting: Neurology

## 2021-04-16 DIAGNOSIS — R200 Anesthesia of skin: Secondary | ICD-10-CM

## 2021-04-16 DIAGNOSIS — G603 Idiopathic progressive neuropathy: Secondary | ICD-10-CM | POA: Diagnosis not present

## 2021-04-16 DIAGNOSIS — M6289 Other specified disorders of muscle: Secondary | ICD-10-CM

## 2021-04-16 DIAGNOSIS — R931 Abnormal findings on diagnostic imaging of heart and coronary circulation: Secondary | ICD-10-CM | POA: Diagnosis not present

## 2021-04-16 NOTE — Progress Notes (Signed)
Full Name: Albert Alvarez Gender: Male MRN #: [id number] Date of Birth: 06-12-1950    Visit Date: 04/16/2021 09:16 Age: 70 Years Examining Physician: Sarina Ill, MD  Requesting Provider: [name], MD Primary Care Provider:  [name], MD Height: 5 feet 11 inch 203lbs  Patient History: Albert Alvarez is a 70 y.o. male here as requested  for neuropathy.  He has a past medical history of degenerative joint disease, hypertrophic cardiomyopathy, kidney stones, macular degeneration, back surgery, trigger finger, elevated coronary artery calcium score and hyperlipidemia. He has knee pain and lower leg pain with sensory symptoms in the feet. He has calf pain, muscle pain, also knee pain. His feet feel mostly numb but also paresthesias.    Summary: Nerve conduction studies and EMG needle study were performed in the right upper and right lower extremities. All nerves and muscles (as indicated in the following tables) were within normal limits.        Conclusion: This is a normal study of the right upper and right lower extremities.  No suggestion of mononeuropathy, large-fiber polyneuropathy or radiculopathy. Small fiber neuropathy can evade detection on this exam however the galvanic sympathetic skin response in the right foot,  an indirect measure of small fiber function, was normal as well.      ------------------------------- Sarina Ill, M.D.  [facility] [address] [place], [place] [address] Tel: [phone] Fax: [phone]  Verbal informed consent was obtained from the patient, patient was informed of potential risk of procedure, including bruising, bleeding, hematoma formation, infection, muscle weakness, muscle pain, numbness, among others.        [place]    Nerve / Sites Muscle Latency Ref. Amplitude Ref. Rel Amp Segments Distance Velocity Ref. Area    ms ms mV mV %  cm m/s m/s mVms  R Median - APB     Wrist APB 4.0 ?4.4 3.4 ?4.0 100  Wrist - APB 7   13.6     Upper arm APB 8.3  3.1  91 Upper arm - Wrist 22 51 ?49 12.7     Ulnar Wrist APB 2.6  4.0  130 Ulnar Wrist - APB    15.1     Ulnar B. Elbow APB 7.1  3.5  86.4 Ulnar B. Elbow - APB    13.9     Ulnar A. Elbow APB 9.1  3.7  105 Ulnar A. Elbow - Ulnar B. Elbow    13.4  R Ulnar - ADM     Wrist ADM 3.1 ?3.3 9.2 ?6.0 100 Wrist - ADM 7   31.8     B.Elbow ADM 6.5  7.3  79.7 B.Elbow - Wrist 18 54 ?49 26.7     A.Elbow ADM 8.4  7.5  103 A.Elbow - B.Elbow 10 52 ?49 29.0  R Peroneal - EDB     Ankle EDB 4.2 ?6.5 6.2 ?2.0 100 Ankle - EDB 9   23.0     Fib head EDB 10.9  5.5  89.2 Fib head - Ankle 30 45 ?44 23.6     Pop fossa EDB 13.2  5.6  101 Pop fossa - Fib head 10 45 ?44 23.3         Pop fossa - Ankle      R Tibial - AH     Ankle AH 3.5 ?5.8 12.6 ?4.0 100 Ankle - AH 9   24.3     Pop fossa AH 13.8  7.2  56.7 Pop fossa - Ankle 43 42 ?41 18.8             SSR    Nerve / Sites Latency   s  R Sympathetic - Foot     Foot 1.86       SNC    Nerve / Sites Rec. Site Peak Lat Ref.  Amp Ref. Segments Distance Peak Diff Ref.    ms ms V V  cm ms ms  R Sural - Ankle (Calf)     Calf Ankle 4.0 ?4.4 8 ?6 Calf - Ankle 14    R Superficial peroneal - Ankle     Lat leg Ankle 4.1 ?4.4 8 ?6 Lat leg - Ankle 14    R Median, Ulnar - Transcarpal comparison     Median Palm Wrist 2.2 ?2.2 79 ?35 Median Palm - Wrist 8       Ulnar Palm Wrist 2.1 ?2.2 25 ?12 Ulnar Palm - Wrist 8          Median Palm - Ulnar Palm  0.1 ?0.4  R Median - Orthodromic (Dig II, Mid palm)     Dig II Wrist 3.2 ?3.4 13 ?10 Dig II - Wrist 13    R Ulnar - Orthodromic, (Dig V, Mid palm)     Dig V Wrist 2.8 ?3.1 7 ?5 Dig V - Wrist 14                 F  Wave    Nerve F Lat Ref.   ms ms  R Tibial - AH 55.6 ?56.0  R Ulnar - ADM 31.2 ?32.0         EMG Summary Table    Spontaneous MUAP Recruitment  Muscle IA Fib PSW Fasc Other Amp Dur. Poly Pattern  R. Iliopsoas Normal None None None _______ Normal Normal Normal Normal   R. Vastus medialis Normal None None None _______ Normal Normal Normal Normal  R. Tibialis anterior Normal None None None _______ Normal Normal Normal Normal  R. Gastrocnemius (Medial head) Normal None None None _______ Normal Normal Normal Normal  R. Extensor hallucis longus Normal None None None _______ Normal Normal Normal Normal  R. Cervical paraspinals (low) Normal None None None _______ Normal Normal Normal Normal  R. Lumbar paraspinals (low) Normal None None None _______ Normal Normal Normal Normal  R. Deltoid Normal None None None _______ Normal Normal Normal Normal  R. Triceps brachii Normal None None None _______ Normal Normal Normal Normal  R. Pronator teres Normal None None None _______ Normal Normal Normal Normal  R. Opponens pollicis Normal None None None _______ Normal Normal Normal Normal  R. First dorsal interosseous Normal None None None _______ Normal Normal Normal Normal

## 2021-04-16 NOTE — Progress Notes (Deleted)
Patient history: He has knee pain an lower leg pain with sensory symptoms in the feet. He has calf pain, muscle pain there. Also knee pain. His feet feel mostly numb but also paresthesias.  Patient is here for EMG nerve conduction study which was normal.  We reviewed below results and work-up and neck steps.   - Knee pain and calf tightness: Feel this may be different than the foot numbness. No DVTs were found on ultrasound. Xrays of bilateral knees(walk in, information given) were not completed but we did refer him to Dr. Maureen Ralphs for for evaluation of knee pain and possible injections - numbness: A thorough evaluation of serum causes for neuropathy including B12, methylmalonic acid, sed rate, CRP, hemoglobin A1c, folate, B1, B6, vitamin D, ANA IFA and comprehensive rheumatologic panel negative, rheumatoid factor negative, ANA titer negative, remainder is negative, CBC unremarkable, CMP unremarkable, TSH normal, CK normal, SPEP and IFE normal, magnesium normal, angio converting enzyme normal, hep C negative, copper and zinc normal.  EMG nerve conduction study was normal. - If the cardiac amyoid scan is negand TTR gene is negative, can refer to [facility] and also can provide [facility] 80-panel genetic testing if wanted.    - Hand pain: Serum was negative for autoimmune disorders.  EMG/NCS negative. - Hypertrophic cardiomyopathy: Consider Transthyretin Amyloid Cardiomyopathy? Familial Transthyretin can also cause neuropathy. -Transthyretin gene - can order via [facility] will send to home. cardiologist ordered amyloid cardiac scan as clinically warranted.  Pending results - Consider vascular studies lower extremities, varicose veins and other vascular problems can cause lower extremity pain however this is less likely

## 2021-04-21 NOTE — Progress Notes (Signed)
Patient history: He has knee pain and lower leg pain with sensory symptoms in the feet. He has calf pain, muscle pain there. Also knee pain. His feet feel mostly numb but also paresthesias.  Patient is here for EMG nerve conduction study which was normal.  We reviewed below results and work-up and neck steps.   - Knee pain and calf tightness: Feel this may be different than the foot numbness. No DVTs were found on ultrasound. Xrays of bilateral knees(walk in, information given) were not completed but we did refer him to Dr. Maureen Ralphs for for evaluation of knee pain and possible injections  - numbness: A thorough evaluation of serum causes for neuropathy including B12, methylmalonic acid, sed rate, CRP, hemoglobin A1c, folate, B1, B6, vitamin D, ANA IFA and comprehensive rheumatologic panel negative, rheumatoid factor negative, ANA titer negative, remainder is negative, CBC unremarkable, CMP unremarkable, TSH normal, CK normal, SPEP and IFE normal, magnesium normal, angio converting enzyme normal, hep C negative, copper and zinc normal.  EMG nerve conduction study was normal.  - If the cardiac amyoid scan is neg and TTR gene is negative, can refer to [facility] and also can provide [facility] 80-panel genetic testing if wanted.    - Hand pain: Serum was negative for autoimmune disorders.  EMG/NCS negative.  - Hypertrophic cardiomyopathy: Consider Transthyretin Amyloid Cardiomyopathy? Familial Transthyretin can also cause neuropathy. -Transthyretin gene - ordered via [facility] will send to home. cardiologist ordered amyloid cardiac scan as clinically warranted.  Pending results  - Consider vascular studies lower extremities, varicose veins and other vascular problems can cause lower extremity pain however this is less likely   I spent over 20 minutes of face-to-face and non-face-to-face time with patient on the  1. Numbness   2. Muscle stiffness   3. Idiopathic progressive polyneuropathy    diagnosis.  This  included previsit chart review, lab review, study review, order entry, electronic health record documentation, patient education on the different diagnostic and therapeutic options, counseling and coordination of care, risks and benefits of management, compliance, or risk factor reduction

## 2021-04-25 NOTE — Procedures (Signed)
Full Name: Albert Alvarez Gender: Male MRN #: [id number] Date of Birth: 06-12-1950    Visit Date: 04/16/2021 09:16 Age: 70 Years Examining Physician: Sarina Ill, MD  Requesting Provider: [name], MD Primary Care Provider:  [name], MD Height: 5 feet 11 inch 203lbs  Patient History: Albert Alvarez is a 70 y.o. male here as requested  for neuropathy.  He has a past medical history of degenerative joint disease, hypertrophic cardiomyopathy, kidney stones, macular degeneration, back surgery, trigger finger, elevated coronary artery calcium score and hyperlipidemia. He has knee pain and lower leg pain with sensory symptoms in the feet. He has calf pain, muscle pain, also knee pain. His feet feel mostly numb but also paresthesias.    Summary: Nerve conduction studies and EMG needle study were performed in the right upper and right lower extremities. All nerves and muscles (as indicated in the following tables) were within normal limits.        Conclusion: This is a normal study of the right upper and right lower extremities.  No suggestion of mononeuropathy, large-fiber polyneuropathy or radiculopathy. Small fiber neuropathy can evade detection on this exam however the galvanic sympathetic skin response in the right foot,  an indirect measure of small fiber function, was normal as well.      ------------------------------- Sarina Ill, M.D.  [facility] [address] [place], [place] [address] Tel: [phone] Fax: [phone]  Verbal informed consent was obtained from the patient, patient was informed of potential risk of procedure, including bruising, bleeding, hematoma formation, infection, muscle weakness, muscle pain, numbness, among others.        [place]    Nerve / Sites Muscle Latency Ref. Amplitude Ref. Rel Amp Segments Distance Velocity Ref. Area    ms ms mV mV %  cm m/s m/s mVms  R Median - APB     Wrist APB 4.0 ?4.4 3.4 ?4.0 100  Wrist - APB 7   13.6     Upper arm APB 8.3  3.1  91 Upper arm - Wrist 22 51 ?49 12.7     Ulnar Wrist APB 2.6  4.0  130 Ulnar Wrist - APB    15.1     Ulnar B. Elbow APB 7.1  3.5  86.4 Ulnar B. Elbow - APB    13.9     Ulnar A. Elbow APB 9.1  3.7  105 Ulnar A. Elbow - Ulnar B. Elbow    13.4  R Ulnar - ADM     Wrist ADM 3.1 ?3.3 9.2 ?6.0 100 Wrist - ADM 7   31.8     B.Elbow ADM 6.5  7.3  79.7 B.Elbow - Wrist 18 54 ?49 26.7     A.Elbow ADM 8.4  7.5  103 A.Elbow - B.Elbow 10 52 ?49 29.0  R Peroneal - EDB     Ankle EDB 4.2 ?6.5 6.2 ?2.0 100 Ankle - EDB 9   23.0     Fib head EDB 10.9  5.5  89.2 Fib head - Ankle 30 45 ?44 23.6     Pop fossa EDB 13.2  5.6  101 Pop fossa - Fib head 10 45 ?44 23.3         Pop fossa - Ankle      R Tibial - AH     Ankle AH 3.5 ?5.8 12.6 ?4.0 100 Ankle - AH 9   24.3     Pop fossa AH 13.8  7.2  56.7 Pop fossa - Ankle 43 42 ?41 18.8             SSR    Nerve / Sites Latency   s  R Sympathetic - Foot     Foot 1.86       SNC    Nerve / Sites Rec. Site Peak Lat Ref.  Amp Ref. Segments Distance Peak Diff Ref.    ms ms V V  cm ms ms  R Sural - Ankle (Calf)     Calf Ankle 4.0 ?4.4 8 ?6 Calf - Ankle 14    R Superficial peroneal - Ankle     Lat leg Ankle 4.1 ?4.4 8 ?6 Lat leg - Ankle 14    R Median, Ulnar - Transcarpal comparison     Median Palm Wrist 2.2 ?2.2 79 ?35 Median Palm - Wrist 8       Ulnar Palm Wrist 2.1 ?2.2 25 ?12 Ulnar Palm - Wrist 8          Median Palm - Ulnar Palm  0.1 ?0.4  R Median - Orthodromic (Dig II, Mid palm)     Dig II Wrist 3.2 ?3.4 13 ?10 Dig II - Wrist 13    R Ulnar - Orthodromic, (Dig V, Mid palm)     Dig V Wrist 2.8 ?3.1 7 ?5 Dig V - Wrist 14                 F  Wave    Nerve F Lat Ref.   ms ms  R Tibial - AH 55.6 ?56.0  R Ulnar - ADM 31.2 ?32.0         EMG Summary Table    Spontaneous MUAP Recruitment  Muscle IA Fib PSW Fasc Other Amp Dur. Poly Pattern  R. Iliopsoas Normal None None None _______ Normal Normal Normal Normal   R. Vastus medialis Normal None None None _______ Normal Normal Normal Normal  R. Tibialis anterior Normal None None None _______ Normal Normal Normal Normal  R. Gastrocnemius (Medial head) Normal None None None _______ Normal Normal Normal Normal  R. Extensor hallucis longus Normal None None None _______ Normal Normal Normal Normal  R. Cervical paraspinals (low) Normal None None None _______ Normal Normal Normal Normal  R. Lumbar paraspinals (low) Normal None None None _______ Normal Normal Normal Normal  R. Deltoid Normal None None None _______ Normal Normal Normal Normal  R. Triceps brachii Normal None None None _______ Normal Normal Normal Normal  R. Pronator teres Normal None None None _______ Normal Normal Normal Normal  R. Opponens pollicis Normal None None None _______ Normal Normal Normal Normal  R. First dorsal interosseous Normal None None None _______ Normal Normal Normal Normal

## 2021-04-28 ENCOUNTER — Telehealth (HOSPITAL_COMMUNITY): Payer: Self-pay | Admitting: Emergency Medicine

## 2021-04-28 NOTE — Telephone Encounter (Signed)
Unable to leave vm message as box is full.  Marchia Bond RN Navigator Cardiac Imaging [facility] [phone] Office  [phone] Cell

## 2021-04-29 ENCOUNTER — Ambulatory Visit (HOSPITAL_COMMUNITY)
Admission: RE | Admit: 2021-04-29 | Discharge: 2021-04-29 | Disposition: A | Discharge status: Home / Self Care | Payer: Medicare Other | Source: Ambulatory Visit | Attending: Cardiovascular Disease | Admitting: Cardiovascular Disease

## 2021-04-29 ENCOUNTER — Other Ambulatory Visit: Payer: Self-pay

## 2021-04-29 DIAGNOSIS — Z01812 Encounter for preprocedural laboratory examination: Secondary | ICD-10-CM | POA: Insufficient documentation

## 2021-04-29 MED ORDER — GADOBUTROL 1 MMOL/ML IV SOLN
10.0000 mL | Freq: Once | INTRAVENOUS | Status: AC | PRN
  Administered 2021-04-29: 10 mL via INTRAVENOUS

## 2021-05-07 DIAGNOSIS — M25561 Pain in right knee: Secondary | ICD-10-CM | POA: Diagnosis not present

## 2021-05-07 DIAGNOSIS — M25562 Pain in left knee: Secondary | ICD-10-CM | POA: Diagnosis not present

## 2021-05-07 DIAGNOSIS — M160 Bilateral primary osteoarthritis of hip: Secondary | ICD-10-CM | POA: Diagnosis not present

## 2021-05-09 ENCOUNTER — Encounter: Payer: Self-pay | Admitting: Neurology

## 2021-05-18 ENCOUNTER — Encounter: Payer: Self-pay | Admitting: Neurology

## 2021-05-19 NOTE — Telephone Encounter (Signed)
Pt's appt has been canceled.

## 2021-05-19 NOTE — Telephone Encounter (Signed)
[facility] genetic testing sample has been received by [facility] and is still pending.

## 2021-05-20 DIAGNOSIS — M25551 Pain in right hip: Secondary | ICD-10-CM | POA: Diagnosis not present

## 2021-05-26 ENCOUNTER — Institutional Professional Consult (permissible substitution): Payer: Medicare Other | Admitting: Neurology

## 2021-06-09 ENCOUNTER — Encounter: Payer: Self-pay | Admitting: Neurology

## 2021-07-16 ENCOUNTER — Encounter: Payer: Self-pay | Admitting: Cardiovascular Disease

## 2021-07-16 DIAGNOSIS — M1611 Unilateral primary osteoarthritis, right hip: Secondary | ICD-10-CM | POA: Diagnosis not present

## 2021-07-16 DIAGNOSIS — M25551 Pain in right hip: Secondary | ICD-10-CM | POA: Diagnosis not present

## 2021-07-16 NOTE — Telephone Encounter (Signed)
This encounter was created in error - please disregard.

## 2021-07-24 ENCOUNTER — Telehealth: Payer: Self-pay

## 2021-07-24 DIAGNOSIS — M1611 Unilateral primary osteoarthritis, right hip: Secondary | ICD-10-CM | POA: Diagnosis not present

## 2021-07-24 DIAGNOSIS — Z5181 Encounter for therapeutic drug level monitoring: Secondary | ICD-10-CM | POA: Diagnosis not present

## 2021-07-24 DIAGNOSIS — Z79899 Other long term (current) drug therapy: Secondary | ICD-10-CM | POA: Diagnosis not present

## 2021-07-24 NOTE — Telephone Encounter (Signed)
° °  Name: Albert Alvarez  DOB: 04-21-1951  MRN: [id number]   Primary Cardiologist: Quay Burow, MD  Chart reviewed as part of pre-operative protocol coverage. Patient was contacted 07/24/2021 in reference to pre-operative risk assessment for pending surgery as outlined below.  Albert Alvarez was last seen on 10/03/2020 by Dr. Gwenlyn Found.  Since that day, Albert Alvarez has done well and has been without symptoms of angina or decompensation.  His primary cardiologist has reviewed the case and felt the patient was acceptable risk for noncardiac surgery.   Therefore, based on ACC/AHA guidelines, the patient would be at acceptable risk for the planned procedure without further cardiovascular testing.   The patient was advised that if he develops new symptoms prior to surgery to contact our office to arrange for a follow-up visit, and he verbalized understanding.  I will route this recommendation to the requesting party via Epic fax function and remove from pre-op pool. Please call with questions.  Christell Faith, PA-C 07/24/2021, 4:13 PM

## 2021-07-24 NOTE — Telephone Encounter (Signed)
Patient returning call.

## 2021-07-24 NOTE — Telephone Encounter (Addendum)
° °  Pre-operative Risk Assessment    Patient Name: Albert Alvarez  DOB: 1950/09/21 MRN: [id number]      Request for Surgical Clearance    Procedure:   Right total hip arthroplasty   Date of Surgery:  Clearance TBD                                 Surgeon:  Dr. Rod Can Surgeon's Group or Practice Name:  [facility] Phone number:  [phone] Fax number:  [phone]   Type of Clearance Requested:   - Medical    Type of Anesthesia:  Spinal   Additional requests/questions:    SignedJacqulynn Cadet   07/24/2021, 3:34 PM

## 2021-07-24 NOTE — Telephone Encounter (Signed)
Pt is returning call to pre op provider not pre op call back. I will forward the call to pre op provider to follow up.

## 2021-07-24 NOTE — Telephone Encounter (Signed)
LMTCB. See input from Dr. Gwenlyn Found via My Chart message.

## 2021-08-07 DIAGNOSIS — M1611 Unilateral primary osteoarthritis, right hip: Secondary | ICD-10-CM | POA: Diagnosis not present

## 2021-08-21 ENCOUNTER — Encounter: Payer: Self-pay | Admitting: Cardiovascular Disease

## 2021-08-21 DIAGNOSIS — Z4732 Aftercare following explantation of hip joint prosthesis: Secondary | ICD-10-CM | POA: Diagnosis not present

## 2021-08-21 DIAGNOSIS — Z471 Aftercare following joint replacement surgery: Secondary | ICD-10-CM | POA: Diagnosis not present

## 2021-08-21 MED ORDER — ATENOLOL 25 MG PO TABS: 25.0000 mg | ORAL_TABLET | Freq: Every day | ORAL | 1 refills | Status: DC

## 2021-09-14 DIAGNOSIS — Z1152 Encounter for screening for COVID-19: Secondary | ICD-10-CM | POA: Diagnosis not present

## 2021-09-14 DIAGNOSIS — J020 Streptococcal pharyngitis: Secondary | ICD-10-CM | POA: Diagnosis not present

## 2021-09-14 DIAGNOSIS — R5383 Other fatigue: Secondary | ICD-10-CM | POA: Diagnosis not present

## 2021-10-01 DIAGNOSIS — Z4789 Encounter for other orthopedic aftercare: Secondary | ICD-10-CM | POA: Diagnosis not present

## 2021-10-12 ENCOUNTER — Encounter: Payer: Self-pay | Admitting: Neurology

## 2021-10-26 DIAGNOSIS — Z125 Encounter for screening for malignant neoplasm of prostate: Secondary | ICD-10-CM | POA: Diagnosis not present

## 2021-10-26 DIAGNOSIS — M5136 Other intervertebral disc degeneration, lumbar region: Secondary | ICD-10-CM | POA: Diagnosis not present

## 2021-10-26 DIAGNOSIS — M2559 Pain in other specified joint: Secondary | ICD-10-CM | POA: Diagnosis not present

## 2021-10-26 DIAGNOSIS — I2510 Atherosclerotic heart disease of native coronary artery without angina pectoris: Secondary | ICD-10-CM | POA: Diagnosis not present

## 2021-10-26 DIAGNOSIS — L820 Inflamed seborrheic keratosis: Secondary | ICD-10-CM | POA: Diagnosis not present

## 2021-10-26 DIAGNOSIS — G629 Polyneuropathy, unspecified: Secondary | ICD-10-CM | POA: Diagnosis not present

## 2021-10-26 DIAGNOSIS — L821 Other seborrheic keratosis: Secondary | ICD-10-CM | POA: Diagnosis not present

## 2021-10-26 DIAGNOSIS — E785 Hyperlipidemia, unspecified: Secondary | ICD-10-CM | POA: Diagnosis not present

## 2021-10-26 DIAGNOSIS — Z79899 Other long term (current) drug therapy: Secondary | ICD-10-CM | POA: Diagnosis not present

## 2021-10-26 DIAGNOSIS — M5030 Other cervical disc degeneration, unspecified cervical region: Secondary | ICD-10-CM | POA: Diagnosis not present

## 2021-10-26 DIAGNOSIS — I422 Other hypertrophic cardiomyopathy: Secondary | ICD-10-CM | POA: Diagnosis not present

## 2021-10-27 DIAGNOSIS — R82998 Other abnormal findings in urine: Secondary | ICD-10-CM | POA: Diagnosis not present

## 2021-11-26 ENCOUNTER — Encounter: Payer: Self-pay | Admitting: Cardiovascular Disease

## 2021-11-26 ENCOUNTER — Ambulatory Visit (INDEPENDENT_AMBULATORY_CARE_PROVIDER_SITE_OTHER): Payer: Medicare Other | Admitting: Cardiovascular Disease

## 2021-11-26 VITALS — BP 126/70 | HR 61 | Ht 71.0 in | Wt 201.0 lb

## 2021-11-26 DIAGNOSIS — Z0000 Encounter for general adult medical examination without abnormal findings: Secondary | ICD-10-CM | POA: Diagnosis not present

## 2021-11-26 NOTE — Progress Notes (Signed)
11/26/2021 Albert Alvarez   November 29, 1950  [id number]  Primary Physician [name], Alvarez Primary Cardiologist: Albert Alvarez Lupe Carney, [place]  HPI:  Albert Alvarez is a 71 y.o.  married Caucasian male father of 59, grandfather to 6 grandchildren was formally a patient of Albert Alvarez and  Albert Alvarez who transferred  his care to myself.  I last saw him in the office 10/03/2020.  His cardiovascular risk profile is entirely benign. He's never had a heart attack or stroke and denies chest pain. Does get some mild dyspnea on exertion. He had a 2-D echo performed several years ago that apparently showed asymmetrical septal hypertrophy with preserved ejection fraction. Likewise, a Myoview stress test which was normal and at that time an event monitor which did not show any arrhythmias.    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  He was the [place] [employer] until last year now he works near 2 days a week as a Optometrist. Of note, he has lost 18 pounds since I last saw him which he attributes to being on Weight Watchers and feels clinically improved.      He walks 3 to 4 miles a day 6 days a week and is otherwise asymptomatic.     Interestingly, Dr. Joylene Draft, his PCP, ordered a coronary calcium score 04/07/2018 which was 251 with calcium in the LAD territory.   Since I saw him a year ago he continues to do well.    He walks on a daily basis and works out on his recumbent bike.  He did have a right total hip replacement 08/07/2021 by Dr. Lyla Glassing and he is slowly recovering from this.  Because of basal septal hypertrophy by echo 10/31/2020 I did a PYP cardiac scintigram that was equivocal for amyloidosis and a cardiac MRI that did not show hypertrophic cardiomyopathy.  He denies chest pain or shortness of breath.  His major complaints sound arthritic.  Current Meds  Medication Sig   atenolol (TENORMIN) 25 MG tablet Take 1 tablet (25 mg total) by mouth daily.   BIOFLAVONOID PRODUCTS  PO Take 1,000 mg by mouth daily.   Cholecalciferol (VITAMIN D PO) Take 4,000 Int'l Units/L by mouth daily.   gabapentin (NEURONTIN) 300 MG capsule Take 300 mg by mouth 3 (three) times daily.   Multiple Vitamin (MULTIVITAMIN) tablet Take 1 tablet by mouth daily.   OVER THE COUNTER MEDICATION Take 1 capsule by mouth daily at 6 (six) AM. Tumeric '250mg'$ , Pine Bark Extract '100mg'$ , Ginger Root Extract '150mg'$    rosuvastatin (CRESTOR) 10 MG tablet      Allergies  Allergen Reactions   Codeine    Nsaids    Shellfish Allergy     Social History   Socioeconomic History   Marital status: Married    Spouse name: Not on file   Number of children: 3   Years of education: Not on file   Highest education level: Not on file  Occupational History   Occupation: [employer]    Employer: [employer]   Occupation: Optometrist    Comment: [place]  Tobacco Use   Smoking status: Never   Smokeless tobacco: Never  Vaping Use   Vaping Use: Never used  Substance and Sexual Activity   Alcohol use: No   Drug use: Never   Sexual activity: Yes  Other Topics Concern   Not on file  Social History Narrative   Caffeine- one diet  coke daily, iced tea approx 2 glasses daily.  Education: 4 yr college post grad. Administrator, arts.  Wife= Albert Alvarez.  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    Social Determinants of Health   Financial Resource Strain: Not on file  Food Insecurity: Not on file  Transportation Needs: Not on file  Physical Activity: Not on file  Stress: Not on file  Social Connections: Not on file  Intimate Partner Violence: Not on file     Review of Systems: General: negative for chills, fever, night sweats or weight changes.  Cardiovascular: negative for chest pain, dyspnea on exertion, edema, orthopnea, palpitations, paroxysmal nocturnal dyspnea or shortness of breath Dermatological: negative for rash Respiratory: negative for cough or wheezing Urologic: negative for hematuria Abdominal: negative for nausea,  vomiting, diarrhea, bright red blood per rectum, melena, or hematemesis Neurologic: negative for visual changes, syncope, or dizziness All other systems reviewed and are otherwise negative except as noted above.    Blood pressure 126/70, pulse 61, height '5\' 11"'$  (1.803 m), weight 201 lb (91.2 kg).  General appearance: alert and no distress Neck: no adenopathy, no carotid bruit, no JVD, supple, symmetrical, trachea midline, and thyroid not enlarged, symmetric, no tenderness/mass/nodules Lungs: clear to auscultation bilaterally Heart: regular rate and rhythm, S1, S2 normal, no murmur, click, rub or gallop Extremities: extremities normal, atraumatic, no cyanosis or edema Pulses: 2+ and symmetric Skin: Skin color, texture, turgor normal. No rashes or lesions Neurologic: Grossly normal for sure.  EKG sinus rhythm at 61 without ST or T wave changes.  Personally reviewed this EKG.  ASSESSMENT AND PLAN:   Hypertrophic cardiomyopathy [facility]) Albert Alvarez has basal septal hypertrophy. PYP  test was equivocal and cardiac MRI did not suggest hypertrophic cardiomyopathy.  Hyperlipidemia History of hyperlipidemia on statin therapy with lipid profile performed 10/26/2021 revealing total cholesterol 141, LDL of 51 and HDL 32.  Elevated coronary artery calcium score Elevated coronary calcium score of 251 with calcium primarily in the LAD territory.  He is totally asymptomatic.  He walks daily and works out on his recumbent bike.     Albert Alvarez FACP,FACC,FAHA, [facility] 11/26/2021 8:15 AM

## 2021-11-26 NOTE — Patient Instructions (Signed)
Medication Instructions:  ?Your physician recommends that you continue on your current medications as directed. Please refer to the Current Medication list given to you today.  ?*If you need a refill on your cardiac medications before your next appointment, please call your pharmacy* ? ? ?Lab Work: ?None ?If you have labs (blood work) drawn today and your tests are completely normal, you will receive your results only by: ?MyChart Message (if you have MyChart) OR ?A paper copy in the mail ?If you have any lab test that is abnormal or we need to change your treatment, we will call you to review the results. ? ? ?Testing/Procedures: ?None ? ? ?Follow-Up: ?At [facility], you and your health needs are our priority.  As part of our continuing mission to provide you with exceptional heart care, we have created designated Provider Care Teams.  These Care Teams include your primary Cardiologist (physician) and Advanced Practice Providers (APPs -  Physician Assistants and Nurse Practitioners) who all work together to provide you with the care you need, when you need it. ? ?We recommend signing up for the patient portal called "MyChart".  Sign up information is provided on this After Visit Summary.  MyChart is used to connect with patients for Virtual Visits (Telemedicine).  Patients are able to view lab/test results, encounter notes, upcoming appointments, etc.  Non-urgent messages can be sent to your provider as well.   ?To learn more about what you can do with MyChart, go to [email or web address].   ? ?Your next appointment:   ?1 year(s) ? ?The format for your next appointment:   ?In Person ? ?Provider:   ?Jonathan Berry, MD   ? ? ?Other Instructions ? ? ?Important Information About Sugar ? ? ? ? ?  ?

## 2021-11-26 NOTE — Assessment & Plan Note (Signed)
Albert Alvarez has basal septal hypertrophy. PYP  test was equivocal and cardiac MRI did not suggest hypertrophic cardiomyopathy.

## 2021-11-26 NOTE — Assessment & Plan Note (Signed)
Elevated coronary calcium score of 251 with calcium primarily in the LAD territory.  He is totally asymptomatic.  He walks daily and works out on his recumbent bike.

## 2021-11-26 NOTE — Assessment & Plan Note (Signed)
History of hyperlipidemia on statin therapy with lipid profile performed 10/26/2021 revealing total cholesterol 141, LDL of 51 and HDL 32.

## 2022-01-05 DIAGNOSIS — H35372 Puckering of macula, left eye: Secondary | ICD-10-CM | POA: Diagnosis not present

## 2022-01-05 DIAGNOSIS — H442A2 Degenerative myopia with choroidal neovascularization, left eye: Secondary | ICD-10-CM | POA: Diagnosis not present

## 2022-02-15 ENCOUNTER — Encounter: Payer: Self-pay | Admitting: Cardiovascular Disease

## 2022-02-15 DIAGNOSIS — M79605 Pain in left leg: Secondary | ICD-10-CM

## 2022-02-18 ENCOUNTER — Other Ambulatory Visit: Payer: Self-pay

## 2022-02-18 MED ORDER — ATENOLOL 25 MG PO TABS: ORAL_TABLET | ORAL | 0 refills | Status: DC

## 2022-02-18 NOTE — Progress Notes (Signed)
Prescription sent to pharmacy.

## 2022-03-01 ENCOUNTER — Other Ambulatory Visit: Payer: Self-pay | Admitting: Cardiovascular Disease

## 2022-03-01 DIAGNOSIS — M79605 Pain in left leg: Secondary | ICD-10-CM

## 2022-03-04 ENCOUNTER — Ambulatory Visit (HOSPITAL_COMMUNITY)
Admission: RE | Admit: 2022-03-04 | Discharge: 2022-03-04 | Disposition: A | Discharge status: Home / Self Care | Payer: Medicare Other | Source: Ambulatory Visit | Attending: Cardiovascular Disease | Admitting: Cardiovascular Disease

## 2022-03-04 DIAGNOSIS — M79604 Pain in right leg: Secondary | ICD-10-CM | POA: Diagnosis not present

## 2022-03-04 DIAGNOSIS — M79605 Pain in left leg: Secondary | ICD-10-CM | POA: Diagnosis not present

## 2022-03-16 DIAGNOSIS — D1801 Hemangioma of skin and subcutaneous tissue: Secondary | ICD-10-CM | POA: Diagnosis not present

## 2022-03-16 DIAGNOSIS — L812 Freckles: Secondary | ICD-10-CM | POA: Diagnosis not present

## 2022-03-16 DIAGNOSIS — L308 Other specified dermatitis: Secondary | ICD-10-CM | POA: Diagnosis not present

## 2022-03-16 DIAGNOSIS — L821 Other seborrheic keratosis: Secondary | ICD-10-CM | POA: Diagnosis not present

## 2022-03-16 DIAGNOSIS — D2261 Melanocytic nevi of right upper limb, including shoulder: Secondary | ICD-10-CM | POA: Diagnosis not present

## 2022-03-16 DIAGNOSIS — D225 Melanocytic nevi of trunk: Secondary | ICD-10-CM | POA: Diagnosis not present

## 2022-05-03 DIAGNOSIS — Z1212 Encounter for screening for malignant neoplasm of rectum: Secondary | ICD-10-CM | POA: Diagnosis not present

## 2022-05-03 DIAGNOSIS — E785 Hyperlipidemia, unspecified: Secondary | ICD-10-CM | POA: Diagnosis not present

## 2022-05-03 DIAGNOSIS — R5383 Other fatigue: Secondary | ICD-10-CM | POA: Diagnosis not present

## 2022-05-03 DIAGNOSIS — R7989 Other specified abnormal findings of blood chemistry: Secondary | ICD-10-CM | POA: Diagnosis not present

## 2022-05-03 DIAGNOSIS — Z125 Encounter for screening for malignant neoplasm of prostate: Secondary | ICD-10-CM | POA: Diagnosis not present

## 2022-05-12 DIAGNOSIS — M2550 Pain in unspecified joint: Secondary | ICD-10-CM | POA: Diagnosis not present

## 2022-05-12 DIAGNOSIS — I2510 Atherosclerotic heart disease of native coronary artery without angina pectoris: Secondary | ICD-10-CM | POA: Diagnosis not present

## 2022-05-12 DIAGNOSIS — R7301 Impaired fasting glucose: Secondary | ICD-10-CM | POA: Diagnosis not present

## 2022-05-12 DIAGNOSIS — Z1331 Encounter for screening for depression: Secondary | ICD-10-CM | POA: Diagnosis not present

## 2022-05-12 DIAGNOSIS — Z1339 Encounter for screening examination for other mental health and behavioral disorders: Secondary | ICD-10-CM | POA: Diagnosis not present

## 2022-05-12 DIAGNOSIS — I422 Other hypertrophic cardiomyopathy: Secondary | ICD-10-CM | POA: Diagnosis not present

## 2022-05-12 DIAGNOSIS — M5030 Other cervical disc degeneration, unspecified cervical region: Secondary | ICD-10-CM | POA: Diagnosis not present

## 2022-05-12 DIAGNOSIS — R1032 Left lower quadrant pain: Secondary | ICD-10-CM | POA: Diagnosis not present

## 2022-05-12 DIAGNOSIS — Z0000 Encounter for general adult medical examination without abnormal findings: Secondary | ICD-10-CM | POA: Diagnosis not present

## 2022-05-12 DIAGNOSIS — Z23 Encounter for immunization: Secondary | ICD-10-CM | POA: Diagnosis not present

## 2022-05-12 DIAGNOSIS — E785 Hyperlipidemia, unspecified: Secondary | ICD-10-CM | POA: Diagnosis not present

## 2022-05-13 DIAGNOSIS — R82998 Other abnormal findings in urine: Secondary | ICD-10-CM | POA: Diagnosis not present

## 2022-05-14 ENCOUNTER — Other Ambulatory Visit: Payer: Self-pay | Admitting: Internal Medicine

## 2022-05-14 DIAGNOSIS — R1032 Left lower quadrant pain: Secondary | ICD-10-CM

## 2022-05-24 ENCOUNTER — Ambulatory Visit
Admission: RE | Admit: 2022-05-24 | Discharge: 2022-05-24 | Disposition: A | Discharge status: Home / Self Care | Payer: Medicare Other | Source: Ambulatory Visit | Attending: Internal Medicine | Admitting: Internal Medicine

## 2022-05-24 DIAGNOSIS — R1032 Left lower quadrant pain: Secondary | ICD-10-CM

## 2022-05-24 DIAGNOSIS — K5730 Diverticulosis of large intestine without perforation or abscess without bleeding: Secondary | ICD-10-CM | POA: Diagnosis not present

## 2022-05-24 DIAGNOSIS — M47816 Spondylosis without myelopathy or radiculopathy, lumbar region: Secondary | ICD-10-CM | POA: Diagnosis not present

## 2022-05-24 MED ORDER — IOPAMIDOL (ISOVUE-300) INJECTION 61%
100.0000 mL | Freq: Once | INTRAVENOUS | Status: AC | PRN
  Administered 2022-05-24: 100 mL via INTRAVENOUS

## 2022-06-18 DIAGNOSIS — M2559 Pain in other specified joint: Secondary | ICD-10-CM | POA: Diagnosis not present

## 2022-06-18 DIAGNOSIS — Z6828 Body mass index (BMI) 28.0-28.9, adult: Secondary | ICD-10-CM | POA: Diagnosis not present

## 2022-06-18 DIAGNOSIS — M7989 Other specified soft tissue disorders: Secondary | ICD-10-CM | POA: Diagnosis not present

## 2022-06-18 DIAGNOSIS — E663 Overweight: Secondary | ICD-10-CM | POA: Diagnosis not present

## 2022-06-23 DIAGNOSIS — Z1211 Encounter for screening for malignant neoplasm of colon: Secondary | ICD-10-CM | POA: Diagnosis not present

## 2022-07-01 DIAGNOSIS — M1991 Primary osteoarthritis, unspecified site: Secondary | ICD-10-CM | POA: Diagnosis not present

## 2022-07-01 DIAGNOSIS — Z6829 Body mass index (BMI) 29.0-29.9, adult: Secondary | ICD-10-CM | POA: Diagnosis not present

## 2022-07-01 DIAGNOSIS — E663 Overweight: Secondary | ICD-10-CM | POA: Diagnosis not present

## 2022-08-09 DIAGNOSIS — H6121 Impacted cerumen, right ear: Secondary | ICD-10-CM | POA: Diagnosis not present

## 2022-08-09 DIAGNOSIS — H903 Sensorineural hearing loss, bilateral: Secondary | ICD-10-CM | POA: Diagnosis not present

## 2022-08-24 ENCOUNTER — Other Ambulatory Visit: Payer: Self-pay | Admitting: Cardiovascular Disease

## 2022-09-15 DIAGNOSIS — H903 Sensorineural hearing loss, bilateral: Secondary | ICD-10-CM | POA: Diagnosis not present

## 2022-10-15 DIAGNOSIS — H903 Sensorineural hearing loss, bilateral: Secondary | ICD-10-CM | POA: Diagnosis not present

## 2022-11-05 ENCOUNTER — Other Ambulatory Visit: Payer: Self-pay

## 2022-11-05 ENCOUNTER — Emergency Department (HOSPITAL_BASED_OUTPATIENT_CLINIC_OR_DEPARTMENT_OTHER)
Admission: EM | Admit: 2022-11-05 | Discharge: 2022-11-06 | Disposition: A | Discharge status: Home / Self Care | Payer: PPO | Attending: Emergency Medicine | Admitting: Emergency Medicine

## 2022-11-05 ENCOUNTER — Encounter (HOSPITAL_BASED_OUTPATIENT_CLINIC_OR_DEPARTMENT_OTHER): Payer: Self-pay | Admitting: *Deleted

## 2022-11-05 DIAGNOSIS — Y9389 Activity, other specified: Secondary | ICD-10-CM | POA: Insufficient documentation

## 2022-11-05 DIAGNOSIS — S0501XA Injury of conjunctiva and corneal abrasion without foreign body, right eye, initial encounter: Secondary | ICD-10-CM | POA: Insufficient documentation

## 2022-11-05 DIAGNOSIS — W228XXA Striking against or struck by other objects, initial encounter: Secondary | ICD-10-CM | POA: Diagnosis not present

## 2022-11-05 DIAGNOSIS — S0591XA Unspecified injury of right eye and orbit, initial encounter: Secondary | ICD-10-CM | POA: Diagnosis present

## 2022-11-05 MED ORDER — TETRACAINE HCL 0.5 % OP SOLN
2.0000 [drp] | Freq: Once | OPHTHALMIC | Status: AC
  Administered 2022-11-06: 2 [drp] via OPHTHALMIC
  Filled 2022-11-05: qty 4

## 2022-11-05 MED ORDER — FLUORESCEIN SODIUM 1 MG OP STRP
1.0000 | ORAL_STRIP | Freq: Once | OPHTHALMIC | Status: AC
  Administered 2022-11-06: 1 via OPHTHALMIC
  Filled 2022-11-05: qty 1

## 2022-11-05 NOTE — ED Triage Notes (Signed)
Pt reports he was tearing up some cardboard boxes, and he the corner of the box hit him in the right eye. States his eye "feels abrasive", watery.

## 2022-11-06 MED ORDER — KETOROLAC TROMETHAMINE 30 MG/ML IJ SOLN
30.0000 mg | Freq: Once | INTRAMUSCULAR | Status: AC
  Administered 2022-11-06: 30 mg via INTRAMUSCULAR
  Filled 2022-11-06: qty 1

## 2022-11-06 MED ORDER — HYDROCODONE-ACETAMINOPHEN 5-325 MG PO TABS
1.0000 | ORAL_TABLET | Freq: Once | ORAL | Status: AC
  Administered 2022-11-06: 1 via ORAL
  Filled 2022-11-06: qty 1

## 2022-11-06 MED ORDER — HYDROCODONE-ACETAMINOPHEN 5-325 MG PO TABS: 1.0000 | ORAL_TABLET | Freq: Four times a day (QID) | ORAL | 0 refills | Status: AC | PRN

## 2022-11-06 MED ORDER — IBUPROFEN 600 MG PO TABS: 600.0000 mg | ORAL_TABLET | Freq: Four times a day (QID) | ORAL | 0 refills | Status: AC | PRN

## 2022-11-06 MED ORDER — ERYTHROMYCIN 5 MG/GM OP OINT: TOPICAL_OINTMENT | OPHTHALMIC | 0 refills | Status: AC

## 2022-11-06 NOTE — ED Provider Notes (Signed)
[facility] EMERGENCY DEPARTMENT AT [facility] Provider Note   CSN: [id number] Arrival date & time: 11/05/22  2329     History  Chief Complaint  Patient presents with   Eye Problem    Albert Alvarez is a 72 y.o. male.  HPI     This is a 72 year old male who presents with right eye pain.  Patient reports he was breaking down cardboard boxes when 1 struck him in the right eye.  This happened around 6 PM.  He has had pain and watering of the right eye.  He reports significant history including detached retina and macular degeneration.  He reports some slight blurry vision.  Reports tetanus is up-to-date.  He took a Tylenol and gabapentin at home but had persistent pain.  Patient does not wear contacts.  Home Medications Prior to Admission medications   Medication Sig Start Date End Date Taking? Authorizing Provider  erythromycin ophthalmic ointment Place a 1/2 inch ribbon of ointment into the lower eyelid. 11/06/22  Yes Vivion Romano, Mayer Masker, MD  HYDROcodone-acetaminophen (NORCO/VICODIN) 5-325 MG tablet Take 1 tablet by mouth every 6 (six) hours as needed for severe pain. 11/06/22  Yes Ren Aspinall, Mayer Masker, MD  ibuprofen (ADVIL) 600 MG tablet Take 1 tablet (600 mg total) by mouth every 6 (six) hours as needed. Limit use to 3-5 days 11/06/22  Yes Karlyn Glasco, Mayer Masker, MD  atenolol (TENORMIN) 25 MG tablet TAKE 1 TABLET BY MOUTH EVERY OTHER DAY ALTERNATING WITH 1/2 TABLET EVERY OTHER DAY FOR 1 WEEK THEN TAKE 1/2 TABLET DAILY FOR 1 WEEK THEN 1/2 TABLET EVERY OTHER DAY FOR 1 WEEK THEN STOP 08/24/22   Runell Gess, MD  BIOFLAVONOID PRODUCTS PO Take 1,000 mg by mouth daily.    [provider]  Cholecalciferol (VITAMIN D PO) Take 4,000 Int'l Units/L by mouth daily.    [provider]  gabapentin (NEURONTIN) 300 MG capsule Take 300 mg by mouth 3 (three) times daily. 09/22/20   [provider]  Multiple Vitamin (MULTIVITAMIN) tablet Take 1 tablet by mouth daily.     [provider]  OVER THE COUNTER MEDICATION Take 1 capsule by mouth daily at 6 (six) AM. Tumeric 250mg , Pine Bark Extract 100mg , Ginger Root Extract 150mg     [provider]  rosuvastatin (CRESTOR) 10 MG tablet  06/20/18   [provider]      Allergies    Codeine, Nsaids, and Shellfish allergy    Review of Systems   Review of Systems  Eyes:  Positive for pain, discharge and redness. Negative for photophobia and visual disturbance.  All other systems reviewed and are negative.   Physical Exam Updated Vital Signs BP 136/77 (BP Location: Left Arm)   Pulse 70   Temp 98.1 F (36.7 C)   Resp 16   SpO2 99%  Physical Exam Vitals and nursing note reviewed.  Constitutional:      Appearance: He is well-developed. He is not ill-appearing.  HENT:     Head: Normocephalic and atraumatic.  Eyes:     Extraocular Movements: Extraocular movements intact.     Pupils: Pupils are equal, round, and reactive to light.     Comments: Fluorescein exam with uptake at 5:00 within the iris, negative Seidel's  Cardiovascular:     Rate and Rhythm: Normal rate and regular rhythm.  Pulmonary:     Effort: Pulmonary effort is normal. No respiratory distress.  Musculoskeletal:     Cervical back: Neck supple.  Lymphadenopathy:  Cervical: No cervical adenopathy.  Skin:    General: Skin is warm and dry.  Neurological:     Mental Status: He is alert and oriented to person, place, and time.  Psychiatric:        Mood and Affect: Mood normal.     ED Results / Procedures / Treatments   Labs (all labs ordered are listed, but only abnormal results are displayed) Labs Reviewed - No data to display  EKG None  Radiology No results found.  Procedures Procedures    Medications Ordered in ED Medications  ketorolac (TORADOL) 30 MG/ML injection 30 mg (has no administration in time range)  tetracaine (PONTOCAINE) 0.5 % ophthalmic solution 2 drop (2 drops Right Eye Given  11/06/22 0006)  fluorescein ophthalmic strip 1 strip (1 strip Right Eye Given 11/06/22 0006)  HYDROcodone-acetaminophen (NORCO/VICODIN) 5-325 MG per tablet 1 tablet (1 tablet Oral Given 11/06/22 0043)    ED Course/ Medical Decision Making/ A&P                             Medical Decision Making Risk Prescription drug management.   This patient presents to the ED for concern of right eye pain, this involves an extensive number of treatment options, and is a complaint that carries with it a high risk of complications and morbidity.  I considered the following differential and admission for this acute, potentially life threatening condition.  The differential diagnosis includes corneal abrasion, traumatic iritis, conjunctivitis, less likely open globe  MDM:    This is a 72 year old male who presents with eye pain after an injury.  He is nontoxic and vital signs are reassuring.  Exam is consistent with corneal abrasion.  There is no foreign body.  No evidence of open globe.  Tetanus is up-to-date.  Patient was given IM Toradol.  He reports that he tolerates NSAIDs although has an allergy for NSAIDs listed.  Recommend scheduled ibuprofen for the next 2 to 3 days.  Will give some Norco for breakthrough pain and erythromycin ointment.  He has an eye doctor to follow-up with.  (Labs, imaging, consults)  Labs: I Ordered, and personally interpreted labs.  The pertinent results include: None  Imaging Studies ordered: I ordered imaging studies including none I independently visualized and interpreted imaging. I agree with the radiologist interpretation  Additional history obtained from chart review.  External records from outside source obtained and reviewed including prior evaluations  Cardiac Monitoring: The patient was not maintained on a cardiac monitor.  If on the cardiac monitor, I personally viewed and interpreted the cardiac monitored which showed an underlying rhythm of:  N/A  Reevaluation: After the interventions noted above, I reevaluated the patient and found that they have :stayed the same  Social Determinants of Health:  lives independently  Disposition: Discharge  Co morbidities that complicate the patient evaluation  Past Medical History:  Diagnosis Date   Detached retina    DJD (degenerative joint disease)    spine   Hypertrophic cardiomyopathy (HCC)    Kidney stones    Macular degeneration      Medicines Meds ordered this encounter  Medications   tetracaine (PONTOCAINE) 0.5 % ophthalmic solution 2 drop   fluorescein ophthalmic strip 1 strip   ketorolac (TORADOL) 30 MG/ML injection 30 mg   HYDROcodone-acetaminophen (NORCO/VICODIN) 5-325 MG per tablet 1 tablet   erythromycin ophthalmic ointment    Sig: Place a 1/2 inch ribbon of ointment into  the lower eyelid.    Dispense:  3.5 g    Refill:  0   HYDROcodone-acetaminophen (NORCO/VICODIN) 5-325 MG tablet    Sig: Take 1 tablet by mouth every 6 (six) hours as needed for severe pain.    Dispense:  5 tablet    Refill:  0   ibuprofen (ADVIL) 600 MG tablet    Sig: Take 1 tablet (600 mg total) by mouth every 6 (six) hours as needed. Limit use to 3-5 days    Dispense:  20 tablet    Refill:  0    I have reviewed the patients home medicines and have made adjustments as needed  Problem List / ED Course: Problem List Items Addressed This Visit   None Visit Diagnoses     Abrasion of right cornea, initial encounter    -  Primary                   Final Clinical Impression(s) / ED Diagnoses Final diagnoses:  Abrasion of right cornea, initial encounter    Rx / DC Orders ED Discharge Orders          Ordered    erythromycin ophthalmic ointment        11/06/22 0042    HYDROcodone-acetaminophen (NORCO/VICODIN) 5-325 MG tablet  Every 6 hours PRN        11/06/22 0042    ibuprofen (ADVIL) 600 MG tablet  Every 6 hours PRN        11/06/22 0042              Shon Baton, MD 11/06/22 [phone]

## 2022-11-06 NOTE — ED Notes (Signed)
Reviewed AVS with patient, patient expressed understanding of directions, denies further questions at this time. 

## 2022-11-06 NOTE — Discharge Instructions (Signed)
You were seen today for eye pain.  You sustained a corneal abrasion.  These heal usually within 1 to 2 days.  You may take ibuprofen or Norco for pain.  Do not drive while taking Norco.  He will be given erythromycin ointment.  If you have any new or worsening symptoms, you should be reevaluated.  Follow-up with your eye doctor.

## 2022-11-10 ENCOUNTER — Telehealth: Payer: Self-pay | Admitting: *Deleted

## 2022-11-10 NOTE — Telephone Encounter (Signed)
Transition Care Management Follow-up Telephone Call Date of discharge and from where: [facility] 11/06/2022 How have you been since you were released from the hospital? Feeling good eye is healing  Any questions or concerns? No Already answered a survey explained different patient answered a few questions and let him go . Not having any concerns presntly and had spoken with doctor and was ok with medicine and transportation if needed  Items Reviewed: Did the pt receive and understand the discharge instructions provided? Yes  Are transportation arrangements needed? No  If their condition worsens, is the pt aware to call PCP or go to the Emergency Dept.? Yes  Alois Cliche -Berneda Rose [facility] [place], Population [facility] [phone] [address] [place] , [place] [address] Email : [email or web address]

## 2023-01-25 DIAGNOSIS — H35372 Puckering of macula, left eye: Secondary | ICD-10-CM | POA: Diagnosis not present

## 2023-01-25 DIAGNOSIS — H35341 Macular cyst, hole, or pseudohole, right eye: Secondary | ICD-10-CM | POA: Diagnosis not present

## 2023-01-25 DIAGNOSIS — H31109 Choroidal degeneration, unspecified, unspecified eye: Secondary | ICD-10-CM | POA: Diagnosis not present

## 2023-01-25 DIAGNOSIS — H35342 Macular cyst, hole, or pseudohole, left eye: Secondary | ICD-10-CM | POA: Diagnosis not present

## 2023-01-31 DIAGNOSIS — H442E3 Degenerative myopia with other maculopathy, bilateral eye: Secondary | ICD-10-CM | POA: Diagnosis not present

## 2023-01-31 DIAGNOSIS — E785 Hyperlipidemia, unspecified: Secondary | ICD-10-CM | POA: Diagnosis not present

## 2023-01-31 DIAGNOSIS — I422 Other hypertrophic cardiomyopathy: Secondary | ICD-10-CM | POA: Diagnosis not present

## 2023-01-31 DIAGNOSIS — M6530 Trigger finger, unspecified finger: Secondary | ICD-10-CM | POA: Diagnosis not present

## 2023-01-31 DIAGNOSIS — I10 Essential (primary) hypertension: Secondary | ICD-10-CM | POA: Diagnosis not present

## 2023-01-31 DIAGNOSIS — R972 Elevated prostate specific antigen [PSA]: Secondary | ICD-10-CM | POA: Diagnosis not present

## 2023-01-31 DIAGNOSIS — M5030 Other cervical disc degeneration, unspecified cervical region: Secondary | ICD-10-CM | POA: Diagnosis not present

## 2023-01-31 DIAGNOSIS — G629 Polyneuropathy, unspecified: Secondary | ICD-10-CM | POA: Diagnosis not present

## 2023-01-31 DIAGNOSIS — I2510 Atherosclerotic heart disease of native coronary artery without angina pectoris: Secondary | ICD-10-CM | POA: Diagnosis not present

## 2023-03-03 DIAGNOSIS — H35373 Puckering of macula, bilateral: Secondary | ICD-10-CM | POA: Diagnosis not present

## 2023-04-18 DIAGNOSIS — B078 Other viral warts: Secondary | ICD-10-CM | POA: Diagnosis not present

## 2023-04-18 DIAGNOSIS — D1801 Hemangioma of skin and subcutaneous tissue: Secondary | ICD-10-CM | POA: Diagnosis not present

## 2023-04-18 DIAGNOSIS — D485 Neoplasm of uncertain behavior of skin: Secondary | ICD-10-CM | POA: Diagnosis not present

## 2023-04-18 DIAGNOSIS — L821 Other seborrheic keratosis: Secondary | ICD-10-CM | POA: Diagnosis not present

## 2023-04-18 DIAGNOSIS — D225 Melanocytic nevi of trunk: Secondary | ICD-10-CM | POA: Diagnosis not present

## 2023-04-18 DIAGNOSIS — L738 Other specified follicular disorders: Secondary | ICD-10-CM | POA: Diagnosis not present

## 2023-04-18 DIAGNOSIS — L820 Inflamed seborrheic keratosis: Secondary | ICD-10-CM | POA: Diagnosis not present

## 2023-07-22 DIAGNOSIS — I10 Essential (primary) hypertension: Secondary | ICD-10-CM | POA: Diagnosis not present

## 2023-07-22 DIAGNOSIS — R972 Elevated prostate specific antigen [PSA]: Secondary | ICD-10-CM | POA: Diagnosis not present

## 2023-07-22 DIAGNOSIS — E785 Hyperlipidemia, unspecified: Secondary | ICD-10-CM | POA: Diagnosis not present

## 2023-07-22 DIAGNOSIS — I2510 Atherosclerotic heart disease of native coronary artery without angina pectoris: Secondary | ICD-10-CM | POA: Diagnosis not present

## 2023-07-29 DIAGNOSIS — H54413A Blindness right eye category 3, normal vision left eye: Secondary | ICD-10-CM | POA: Diagnosis not present

## 2023-07-29 DIAGNOSIS — E785 Hyperlipidemia, unspecified: Secondary | ICD-10-CM | POA: Diagnosis not present

## 2023-07-29 DIAGNOSIS — R82998 Other abnormal findings in urine: Secondary | ICD-10-CM | POA: Diagnosis not present

## 2023-07-29 DIAGNOSIS — M5030 Other cervical disc degeneration, unspecified cervical region: Secondary | ICD-10-CM | POA: Diagnosis not present

## 2023-07-29 DIAGNOSIS — Z0000 Encounter for general adult medical examination without abnormal findings: Secondary | ICD-10-CM | POA: Diagnosis not present

## 2023-07-29 DIAGNOSIS — I10 Essential (primary) hypertension: Secondary | ICD-10-CM | POA: Diagnosis not present

## 2023-07-29 DIAGNOSIS — I82813 Embolism and thrombosis of superficial veins of lower extremities, bilateral: Secondary | ICD-10-CM | POA: Diagnosis not present

## 2023-07-29 DIAGNOSIS — Z23 Encounter for immunization: Secondary | ICD-10-CM | POA: Diagnosis not present

## 2023-07-29 DIAGNOSIS — I129 Hypertensive chronic kidney disease with stage 1 through stage 4 chronic kidney disease, or unspecified chronic kidney disease: Secondary | ICD-10-CM | POA: Diagnosis not present

## 2023-07-29 DIAGNOSIS — R7301 Impaired fasting glucose: Secondary | ICD-10-CM | POA: Diagnosis not present

## 2023-07-29 DIAGNOSIS — G629 Polyneuropathy, unspecified: Secondary | ICD-10-CM | POA: Diagnosis not present

## 2023-07-29 DIAGNOSIS — I422 Other hypertrophic cardiomyopathy: Secondary | ICD-10-CM | POA: Diagnosis not present

## 2023-07-29 DIAGNOSIS — N183 Chronic kidney disease, stage 3 unspecified: Secondary | ICD-10-CM | POA: Diagnosis not present

## 2023-07-29 DIAGNOSIS — I739 Peripheral vascular disease, unspecified: Secondary | ICD-10-CM | POA: Diagnosis not present

## 2023-08-02 ENCOUNTER — Other Ambulatory Visit: Payer: Self-pay | Admitting: Internal Medicine

## 2023-08-02 DIAGNOSIS — M5030 Other cervical disc degeneration, unspecified cervical region: Secondary | ICD-10-CM

## 2023-08-12 ENCOUNTER — Ambulatory Visit
Admission: RE | Admit: 2023-08-12 | Discharge: 2023-08-12 | Disposition: A | Discharge status: Home / Self Care | Payer: PPO | Source: Ambulatory Visit | Attending: Internal Medicine | Admitting: Internal Medicine

## 2023-08-12 DIAGNOSIS — M5030 Other cervical disc degeneration, unspecified cervical region: Secondary | ICD-10-CM

## 2023-08-31 DIAGNOSIS — Z6827 Body mass index (BMI) 27.0-27.9, adult: Secondary | ICD-10-CM | POA: Diagnosis not present

## 2023-08-31 DIAGNOSIS — M4807 Spinal stenosis, lumbosacral region: Secondary | ICD-10-CM | POA: Diagnosis not present

## 2023-09-02 ENCOUNTER — Other Ambulatory Visit: Payer: Self-pay | Admitting: Neurological Surgery

## 2023-09-02 DIAGNOSIS — M4807 Spinal stenosis, lumbosacral region: Secondary | ICD-10-CM

## 2023-09-09 ENCOUNTER — Ambulatory Visit
Admission: RE | Admit: 2023-09-09 | Discharge: 2023-09-09 | Disposition: A | Discharge status: Home / Self Care | Source: Ambulatory Visit | Attending: Neurological Surgery | Admitting: Neurological Surgery

## 2023-09-09 DIAGNOSIS — M4807 Spinal stenosis, lumbosacral region: Secondary | ICD-10-CM

## 2023-09-09 DIAGNOSIS — G319 Degenerative disease of nervous system, unspecified: Secondary | ICD-10-CM | POA: Diagnosis not present

## 2023-09-09 DIAGNOSIS — R9082 White matter disease, unspecified: Secondary | ICD-10-CM | POA: Diagnosis not present

## 2023-09-09 MED ORDER — GADOPICLENOL 0.5 MMOL/ML IV SOLN
9.0000 mL | Freq: Once | INTRAVENOUS | Status: AC | PRN
  Administered 2023-09-09: 9 mL via INTRAVENOUS

## 2023-09-16 DIAGNOSIS — M4712 Other spondylosis with myelopathy, cervical region: Secondary | ICD-10-CM | POA: Diagnosis not present

## 2023-09-16 DIAGNOSIS — Z6827 Body mass index (BMI) 27.0-27.9, adult: Secondary | ICD-10-CM | POA: Diagnosis not present

## 2024-01-31 DIAGNOSIS — Z961 Presence of intraocular lens: Secondary | ICD-10-CM | POA: Diagnosis not present

## 2024-01-31 DIAGNOSIS — H43812 Vitreous degeneration, left eye: Secondary | ICD-10-CM | POA: Diagnosis not present

## 2024-01-31 DIAGNOSIS — H31109 Choroidal degeneration, unspecified, unspecified eye: Secondary | ICD-10-CM | POA: Diagnosis not present

## 2024-01-31 DIAGNOSIS — H35372 Puckering of macula, left eye: Secondary | ICD-10-CM | POA: Diagnosis not present

## 2024-01-31 DIAGNOSIS — H35341 Macular cyst, hole, or pseudohole, right eye: Secondary | ICD-10-CM | POA: Diagnosis not present

## 2024-01-31 DIAGNOSIS — H04123 Dry eye syndrome of bilateral lacrimal glands: Secondary | ICD-10-CM | POA: Diagnosis not present

## 2024-04-25 DIAGNOSIS — C4359 Malignant melanoma of other part of trunk: Secondary | ICD-10-CM | POA: Diagnosis not present

## 2024-04-25 DIAGNOSIS — L812 Freckles: Secondary | ICD-10-CM | POA: Diagnosis not present

## 2024-04-25 DIAGNOSIS — D2262 Melanocytic nevi of left upper limb, including shoulder: Secondary | ICD-10-CM | POA: Diagnosis not present

## 2024-04-25 DIAGNOSIS — D1801 Hemangioma of skin and subcutaneous tissue: Secondary | ICD-10-CM | POA: Diagnosis not present

## 2024-04-25 DIAGNOSIS — D485 Neoplasm of uncertain behavior of skin: Secondary | ICD-10-CM | POA: Diagnosis not present

## 2024-04-25 DIAGNOSIS — L821 Other seborrheic keratosis: Secondary | ICD-10-CM | POA: Diagnosis not present

## 2024-04-25 DIAGNOSIS — D225 Melanocytic nevi of trunk: Secondary | ICD-10-CM | POA: Diagnosis not present

## 2024-05-10 DIAGNOSIS — D485 Neoplasm of uncertain behavior of skin: Secondary | ICD-10-CM | POA: Diagnosis not present

## 2024-05-16 DIAGNOSIS — C4359 Malignant melanoma of other part of trunk: Secondary | ICD-10-CM | POA: Diagnosis not present
